# Patient Record
Sex: Female | Born: 1990
Health system: Southern US, Community
[De-identification: ages and names within clinical notes are randomized; demographics above are authoritative.]

## PROBLEM LIST (undated history)

## (undated) DIAGNOSIS — F32A Depression, unspecified: Secondary | ICD-10-CM

## (undated) DIAGNOSIS — G43909 Migraine, unspecified, not intractable, without status migrainosus: Secondary | ICD-10-CM

## (undated) DIAGNOSIS — F4310 Post-traumatic stress disorder, unspecified: Secondary | ICD-10-CM

## (undated) DIAGNOSIS — D680 Von Willebrand's disease: Secondary | ICD-10-CM

## (undated) DIAGNOSIS — F329 Major depressive disorder, single episode, unspecified: Secondary | ICD-10-CM

## (undated) DIAGNOSIS — K649 Unspecified hemorrhoids: Secondary | ICD-10-CM

## (undated) HISTORY — DX: Depression, unspecified: F32.A

## (undated) HISTORY — PX: NO PAST SURGERIES: SHX2092

## (undated) HISTORY — DX: Post-traumatic stress disorder, unspecified: F43.10

## (undated) HISTORY — DX: Major depressive disorder, single episode, unspecified: F32.9

---

## 2014-11-18 LAB — OB RESULTS CONSOLE HIV ANTIBODY (ROUTINE TESTING): HIV: NONREACTIVE

## 2014-11-18 LAB — OB RESULTS CONSOLE RUBELLA ANTIBODY, IGM: RUBELLA: NON-IMMUNE/NOT IMMUNE

## 2014-11-18 LAB — OB RESULTS CONSOLE HEPATITIS B SURFACE ANTIGEN: Hepatitis B Surface Ag: NEGATIVE

## 2014-11-18 LAB — OB RESULTS CONSOLE VARICELLA ZOSTER ANTIBODY, IGG: Varicella: IMMUNE

## 2014-11-18 LAB — OB RESULTS CONSOLE RPR: RPR: NONREACTIVE

## 2014-12-01 ENCOUNTER — Ambulatory Visit (HOSPITAL_BASED_OUTPATIENT_CLINIC_OR_DEPARTMENT_OTHER)
Admission: RE | Admit: 2014-12-01 | Discharge: 2014-12-01 | Disposition: A | Discharge status: Home / Self Care | Payer: Federal, State, Local not specified - PPO | Source: Ambulatory Visit | Attending: Obstetrics and Gynecology | Admitting: Obstetrics and Gynecology

## 2014-12-01 ENCOUNTER — Ambulatory Visit
Admission: RE | Admit: 2014-12-01 | Discharge: 2014-12-01 | Disposition: A | Discharge status: Home / Self Care | Payer: Federal, State, Local not specified - PPO | Source: Ambulatory Visit | Attending: Obstetrics and Gynecology | Admitting: Obstetrics and Gynecology

## 2014-12-01 VITALS — BP 115/69 | HR 70 | Temp 98.3°F | Resp 18 | Ht <= 58 in | Wt 154.0 lb

## 2014-12-01 DIAGNOSIS — Z3401 Encounter for supervision of normal first pregnancy, first trimester: Secondary | ICD-10-CM | POA: Diagnosis not present

## 2014-12-01 DIAGNOSIS — G43909 Migraine, unspecified, not intractable, without status migrainosus: Secondary | ICD-10-CM | POA: Insufficient documentation

## 2014-12-01 DIAGNOSIS — D6800 Von Willebrand disease, unspecified: Secondary | ICD-10-CM

## 2014-12-01 DIAGNOSIS — Z3A09 9 weeks gestation of pregnancy: Secondary | ICD-10-CM | POA: Diagnosis not present

## 2014-12-01 DIAGNOSIS — G43109 Migraine with aura, not intractable, without status migrainosus: Secondary | ICD-10-CM | POA: Diagnosis not present

## 2014-12-01 DIAGNOSIS — D6801 Von willebrand disease, type 1: Secondary | ICD-10-CM

## 2014-12-01 DIAGNOSIS — O26891 Other specified pregnancy related conditions, first trimester: Secondary | ICD-10-CM | POA: Diagnosis not present

## 2014-12-01 HISTORY — DX: Migraine, unspecified, not intractable, without status migrainosus: G43.909

## 2014-12-01 HISTORY — DX: Von Willebrand's disease: D68.0

## 2014-12-01 NOTE — Progress Notes (Signed)
Referring physician:  [facility] Length of Consultation: 40 minutes   Erica Serrano  was referred to [facility] for genetic counseling to discuss her personal history of von Willebrand disease, family history of childhood cancer,  and to review prenatal screening and testing options.  This note summarizes the information we discussed.  She also met with Dr. Jimmey Ralph to discuss her medical history and pregnancy management.  We obtained a detailed family history and pregnancy history.  This is the first pregnancy for Erica Serrano and her partner, Erica Serrano.  She is 24 years old and at [redacted] weeks gestation.  She reports a personal history of von Willebrand disease type 1 and complex migraines (see MFM note).  Her von Willebrand disease was diagnosed in adolescence when she experienced abnormal periods.  She reports easy bruising but no significant bleeding.  Dr. Leatha Gilding ordered a von Willebrand panel today as well as a consultation with hematology.  The patient will return to our clinic once the lab results are available for further discussion.  In the family, her paternal half sister is said to also have von Willebrand based upon verbal report.  No other family members are noted to have symptoms of this condition.  Also in the family, Erica Serrano reported that her sister passed away at 75 years of age due to a tumor which arose under her ribcage.  The patient is not aware of the tissue in which this tumor originated nor the type of tumor.  She did say that the family obtain records some time ago and upon review were told that it was a sporadic occurrence.  There may be many types of cancers that occur in childhood.  Some of those are thought to be the result of inherited genetic changes, while many occur by chance and would not be expected to increase the risk for other family members to have a similar condition.  There is no other family history of childhood tumors or cancer  occuring at young ages which would suggest a genetic syndrome; however, without additional medical information, we cannot comment further.  The remainder of the family history was reported to be unremarkable for birth defects, mental retardation, recurrent pregnancy loss or known chromosome abnormalities.  Erica Serrano has had no complications or exposures in this pregnancy that would be expected to increase the risk for birth defects.  She takes only tylenol for her migraines.  Von Willebrand disease is an inherited bleeding disorder.  Approximately 70% of individuals with the condition have type 1, which is the most mild form.  This type typically manifests with mild mucocutaneous bleeding and heavy periods. The condition is typically inherited as a dominant genetic trait caused by a change in the von Willebrand Factor gene.  Most individuals with this condition have an affected parent, though rarely it may be the result of a new mutation.  This means that when one parent has a gene change for the condition, there is a 50% chance that they will pass on the abnormal copy of the gene and a 50% chance to pass on the normal copy of the gene to each of their children.  There are rare reported families with a recessive form of type 1 vWD.  Genetic testing on a pregnancy or other family members is possible if the mutation in the family is known.  Prenatal testing is only available through invasive testing such as CVS or amniocentesis.  If a pregnancy is at risk  for vWD and the child is a female, then assessment of his clotting would be important prior to circumcision.  Testing of his clotting function could be performed after delivery.  We can discuss this further if desired at her follow up visit with MFM after all of Erica Serrano labs are available.  The following routine screening tests to assess for chromosome conditions were reviewed with the patient:  First trimester screening, which can include nuchal  translucency ultrasound screen and/or first trimester maternal serum marker screening.  The nuchal translucency has approximately an 80% detection rate for Down syndrome and can be positive for other chromosome abnormalities as well as congenital heart defects.  When combined with a maternal serum marker screening, the detection rate is up to 90% for Down syndrome and up to 97% for trisomy 18.     Maternal serum marker screening, a blood test that measures pregnancy proteins, can provide risk assessments for Down syndrome, trisomy 18, and open neural tube defects (spina bifida, anencephaly). Because it does not directly examine the fetus, it cannot positively diagnose or rule out these problems.  Targeted ultrasound uses high frequency sound waves to create an image of the developing fetus.  An ultrasound is often recommended as a routine means of evaluating the pregnancy.  It is also used to screen for fetal anatomy problems (for example, a heart defect) that might be suggestive of a chromosomal or other abnormality.   Should these screening tests indicate an increased concern, then the following additional testing options would be offered:  The chorionic villus sampling procedure is available for first trimester chromosome analysis.  This involves the withdrawal of a small amount of chorionic villi (tissue from the developing placenta).  Risk of pregnancy loss is estimated to be approximately 1 in 200 to 1 in 100 (0.5 to 1%).  There is approximately a 1% (1 in 100) chance that the CVS chromosome results will be unclear.  Chorionic villi cannot be tested for neural tube defects.     Amniocentesis involves the removal of a small amount of amniotic fluid from the sac surrounding the fetus with the use of a thin needle inserted through the maternal abdomen and uterus.  Ultrasound guidance is used throughout the procedure.  Fetal cells from amniotic fluid are directly evaluated and > 99.5% of chromosome  problems and > 98% of open neural tube defects can be detected. This procedure is generally performed after the 15th week of pregnancy.  The main risks to this procedure include complications leading to miscarriage in less than 1 in 200 cases (0.5%).  As another option for information if the pregnancy is suspected to be an an increased chance for certain chromosome conditions, we also reviewed the availability of cell free fetal DNA testing from maternal blood to determine whether or not the baby may have either Down syndrome, trisomy 61, or trisomy 50.  This test utilizes a maternal blood sample and DNA sequencing technology to isolate circulating cell free fetal DNA from maternal plasma.  The fetal DNA can then be analyzed for DNA sequences that are derived from the three most common chromosomes involved in aneuploidy, chromosomes 13, 18, and 21.  If the overall amount of DNA is greater than the expected level for any of these chromosomes, aneuploidy is suspected.  While we do not consider it a replacement for invasive testing and karyotype analysis, a negative result from this testing would be reassuring, though not a guarantee of a normal chromosome complement  for the baby.  An abnormal result is certainly suggestive of an abnormal chromosome complement, though we would still recommend CVS or amniocentesis to confirm any findings from this testing.  Cystic Fibrosis screening was also discussed with the patient. Cystic fibrosis (CF) is one of the most common genetic conditions in persons of Caucasian ancestry.  This condition occurs in approximately 1 in 2,500 Caucasian persons and results in thickened secretions in the lungs, digestive, and reproductive systems.  For a baby to be at risk for having CF, both of the parents must be carriers for this condition.  Approximately 1 in 49 Caucasian persons is a carrier for CF.  Current carrier testing looks for the most common mutations in the gene for CF and can  detect approximately 90% of carriers in the Caucasian population.  This means that the carrier screening can greatly reduce, but cannot eliminate, the chance for an individual to have a child with CF.  If an individual is found to be a carrier for CF, then carrier testing would be available for the partner. As part of [place] newborn screening profile, all babies born in the state of [place] will have a two-tier screening process.  Specimens are first tested to determine the concentration of immunoreactive trypsinogen (IRT).  The top 5% of specimens with the highest IRT values then undergo DNA testing using a panel of over 40 common CF mutations.   After consideration of the options, Erica Serrano elected to proceed with von Willebrand panel today and follow up consultations with MFM, hematology and neurology (see MFM note).  She is scheduled for first trimester screening at [facility] at the appropriate gestational age.  Erica Serrano was encouraged to call with questions or concerns.  We can be contacted at [phone].   Wilburt Finlay, MS, CGC

## 2014-12-01 NOTE — Progress Notes (Signed)
[facility] Consultation   Chief Complaint:  migraines in pregnancy , h/o von willebrands   HPI: Erica. Erica Serrano is a 24 y.o. G1P0 at 3w6dby LMP  who [facility]ra[facility] and Von Willebrands dz Type 1  Pt [facility] age 89[facility] to heavy menses . Dx of Von willebrands was made and pt given Stimate and OCPs . Pt used stimate (ddavp) only once. OCPs had to be adjusted because of effect on her migraines. No dental extractions, no surgeries . Pt was in [employer] [place]. She does have severe migraines since puberty . She has an aura and blurs vision in one eye . She develops numbness and loss of control of her right arm. She uses tylenol and rest . She has adjusted her lifestyle no caffeine, rare alcohol, no chocolate  etc with limited success . She has missed 2 days of classes at [employer] (she is a senior to graduate in December ) due to migraines too severe to tolerate light . She only uses tylenol . She tried topamax - no impact on her HAs, made her hands numb . She had one stretch of 18 months without a migraine. She is not clear what changed then. She does not have a neurologist.   Past Medical History: Patient  has a past medical history of Von Willebrand disease and Migraines.  Past Surgical History: She  has past surgical history that includes No past surgeries.  Obstetric History:  OB History    Gravida Para Term Preterm AB TAB SAB Ectopic Multiple Living   1              Gynecologic History:  Patient's last menstrual period was 10/03/2014.  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  Social  History: Patient  reports that she has never smoked. She has never used smokeless tobacco. She reports that she does not drink alcohol or use illicit drugs.  Family History: family history is not on file.  Review of Systems A full 12 point review of systems was negative or as noted in the History of Present Illness.  Physical Exam: BP 115/69 mmHg  Pulse 70  Temp(Src) 98.3 F (36.8 C) (Oral)  Resp 18  Ht 5.4" (0.137 m)  Wt 154 lb (69.854 kg)  BMI 3721.78 kg/m2  SpO2 99%  LMP 10/03/2014  Well appearing WF accompanied by her husband  No gross  neurologic deficits    Asessement: 1. Von Willebrand disease, type I   2. Von Willebrands disease   3. Migraine with aura and without status migrainosus, not intractable   4. Encounter for supervision of normal first pregnancy in first trimester     Plan: 1 I obtained a Von Willebrands panel and reviewed the risk of PPH and late pp bleeding - I told her that usually the amount of Von willebrands factor is increased in pregnancy , I called Dr FGrayland Ormondand requested a consult so that she might have a local hematologist - His office will contact her with an appointment. I requested she return when  Dr Jeneen Rinks is here to review her Von Willbrands panel and to discuss delivery planning. Consider a OB anesthesia consult in the third trimester due to increased risk of PPH. Avoid NSAIDs pp.  2 Ongoing  migraines impacting her daily function - I suggested she see a neurologist with familiarity treating pregnant pts - pt has not tried Triptans , beta blockers or amitryptiline - - I referred her to Dr Quentin Angst at [facility] for advice in controlling her HAs. Message was left with his scheduler and I emailed him since his next available is in February.    Total time spent with the patient was 30 minutes with greater than 50% spent in counseling and coordination of care. We appreciate this interesting consult and will be happy to be involved in the ongoing care of  Erica. Serrano in anyway her obstetricians desire.  Gatha Mayer MD  [place]

## 2014-12-02 LAB — VON WILLEBRAND PANEL
COAGULATION FACTOR VIII: 82 % (ref 50–150)
RISTOCETIN CO-FACTOR, PLASMA: 60 % (ref 50–150)
Von Willebrand Antigen, Plasma: 61 % (ref 50–150)

## 2014-12-02 LAB — COAG STUDIES INTERP REPORT: PDF IMAGE: 0

## 2014-12-22 ENCOUNTER — Inpatient Hospital Stay: Payer: Federal, State, Local not specified - PPO | Admitting: Oncology

## 2014-12-22 NOTE — Addendum Note (Signed)
Encounter addended by: Jimmey Ralph, MD on: 12/22/2014  8:52 AM<BR>     Documentation filed: Charges VN

## 2014-12-22 NOTE — Addendum Note (Signed)
Encounter addended by: Jimmey Ralph, MD on: 12/22/2014  8:42 AM<BR>     Documentation filed: Notes Section, Problem List

## 2014-12-22 NOTE — Progress Notes (Signed)
Pt with h/o of VWdz - VW labs drawn  And were normal  Pt has a h/o of heavy menses - monitor for late pp bleeding  Consider steering her away form aspirin and NSAIDs   If abnromal bleeding pattern occurs consider PFA 100 or other platelet function assay   Jimmey Ralph

## 2015-01-02 ENCOUNTER — Ambulatory Visit
Admission: RE | Admit: 2015-01-02 | Discharge: 2015-01-02 | Disposition: A | Discharge status: Home / Self Care | Payer: Federal, State, Local not specified - PPO | Source: Ambulatory Visit | Attending: Maternal & Fetal Medicine | Admitting: Maternal & Fetal Medicine

## 2015-01-02 VITALS — BP 134/76 | HR 75 | Temp 98.2°F | Wt 161.0 lb

## 2015-01-02 DIAGNOSIS — D680 Von Willebrand's disease: Secondary | ICD-10-CM

## 2015-01-02 DIAGNOSIS — G43101 Migraine with aura, not intractable, with status migrainosus: Secondary | ICD-10-CM | POA: Diagnosis not present

## 2015-01-02 DIAGNOSIS — Z3A14 14 weeks gestation of pregnancy: Secondary | ICD-10-CM | POA: Diagnosis not present

## 2015-01-02 DIAGNOSIS — D6801 Von willebrand disease, type 1: Secondary | ICD-10-CM

## 2015-01-02 NOTE — Progress Notes (Signed)
[facility] Consultation   Chief Complaint: VWD in pegnancy  HPI: [name] is a 24 y.o. G1P0 at [redacted]w[redacted]d by 8 week [name] at [facility] who was originally referred to [facility] due to her history of type 1 VWD and her history of complex migraines.  She is here to review her VWD panel and for  follow-up consultation regarding VWD in pregnancy.  The patient was originally diagnosed in the sixth grade at [employer] after suffering heavy menstrual bleeding she was bleeding 2 weeks out of the month. She was diagnosed with type I VWD.  She was prescribed oral contraceptive pills to reduce her heavy menstrual bleeding and regularly her menses. She was also given a prescription for Stimate, which she has used only twice in her life.  She has no current bleeding symptoms.  As for the complex migraines, she has had severe migraines since puberty . She has an aura and blurs vision in one eye . She develops numbness and loss of control of her right arm. She treats them with Tylenol and rest.  Dr. Lehman Prom referred her to Dr. Theda Sers, neurologist, at [facility], but the appointment was not until February. The patient was rescheduled with Dr. Melrose Nakayama at [facility], but the patient did not know that the appointment was today and missed it.    Gynecologic History:  Patient's last menstrual period was 10/03/2014.  History of heavy menstrual bleeding.    Past Medical History: Patient  has a past medical history of Von Willebrand disease and complex migraines.   Past Surgical History: No past surgeries.   Medications: PN vitamins  Allergies: Patient has No Known Allergies.   Social History: Patient  reports that she has never smoked. She has never used smokeless tobacco. She reports that she does not drink alcohol or use illicit drugs.  The patient is a Charity fundraiser at [employer]. Her husband is a Educational psychologist for in [employer].  Family  History: family history includes Cancer in her sister; Heart disease in her father - he underwent triple bypass without any bleeding complications; ?Von Willebrand disease in her half sister on her father's side.   Her husband, although he has had no surgery, has had absolutely no bleeding symptoms.    Review of Systems A full 12 point review of systems was negative or as noted in the History of Present Illness. She specifically denies any bleeding or spotting.  Physical Exam: BP 134/76 mmHg  Pulse 75  Temp(Src) 98.2 F (36.8 C)  Wt 161 lb (73.029 kg)  LMP 10/03/2014  VW Panel from 12/02/2014 Coagulation Factor VIII 50 - 150 % 82   Ristocetin Co-factor, Plasma 50 - 150 % 60   Von Willebrand Antigen, Plasma 50 - 150 % 61          Asessement: 24 yo gravida G1P0 at [redacted]w[redacted]d with: 1. Von Willebrand disease with low normal VWF levels now that she is pregnant 2. Complex migraine  Recommendations:  I met with the patient and her husband 1. Von Willebrand disease with low normal VWF levels now that she is pregnant -- The patient has previously been counseled that she is at increased risk for postpartum hemorrhage. I might add that she is at particularly increased risk for delayed postpartum hemorrhage. The risk of postpartum hemorrhage is approximately doubled in women with von Willebrand disease. -- Besides active management of the third stage of labor, I would recommend that a second line  oxytocic be ready in the delivery area. -- Given that her VWF levels are greater than 50% and that she has no history of hemorrhage, she should be able to deliver here at [facility]. -- She has already been referred to Dr. Maryjane Hurter in hematology so that she has a hematologist to follow her in the future, particularly after delivery. -- The patient was counseled that her child has a 50% chance of inheriting von Willebrand disease. Since her husband has no bleeding history and no family  history of a bleeding disorder, it is extremely unlikely that her child would have a severe bleeding disorder. Therefore, a cesarean delivery would not be necessary.  However, because of the slightly increased risk of intracranial hemorrhage, invasive procedures should be avoided such as fetal scalp electrode and operative vaginal delivery. There should be a low threshold for cesarean delivery. If operative vaginal delivery is unavoidable, forceps are preferred to vacuum extraction.  (The risk of intracranial hemorrhage is less with forceps and then with vacuum extraction.)  Vacuum extraction should also not be used to help deliver the fetal head at the time of a cesarean delivery. -- Should she be carrying a female fetus, I would recommend that a circumcision be postponed until the pediatrician has established that the infant does not have von Willebrand factor levels below 50%. -- At the time of delivery, I would recommend that umbilical cord blood in two light blue topped tubes be sent for APTT and a von Willebrand panel. -- I would recommend that prior to delivery, the patient have an opportunity to speak to the child's pediatric provider. -- I would suggest that the patient return to [place] at [redacted] weeks gestation to see me and I will repeat her VWF panel. 2. Complex migraine -- The patient will reschedule her appointment with Dr. Melrose Nakayama.  Total time spent with the patient was 40 minutes with greater than 50% spent in counseling and coordination of care.   Erasmo Score, MD [facility]

## 2015-01-05 ENCOUNTER — Inpatient Hospital Stay: Payer: Federal, State, Local not specified - PPO | Attending: Oncology | Admitting: Oncology

## 2015-01-05 VITALS — BP 112/78 | HR 101 | Temp 97.4°F | Resp 20 | Ht 64.0 in | Wt 162.9 lb

## 2015-01-05 DIAGNOSIS — Z79899 Other long term (current) drug therapy: Secondary | ICD-10-CM | POA: Insufficient documentation

## 2015-01-05 DIAGNOSIS — D6800 Von Willebrand disease, unspecified: Secondary | ICD-10-CM

## 2015-01-05 DIAGNOSIS — Z8669 Personal history of other diseases of the nervous system and sense organs: Secondary | ICD-10-CM | POA: Insufficient documentation

## 2015-01-05 DIAGNOSIS — Z3A15 15 weeks gestation of pregnancy: Secondary | ICD-10-CM | POA: Insufficient documentation

## 2015-01-05 DIAGNOSIS — Z809 Family history of malignant neoplasm, unspecified: Secondary | ICD-10-CM | POA: Diagnosis not present

## 2015-01-05 NOTE — Progress Notes (Signed)
Patient here today for initial hematology evaluation regarding Von Willebrands, patient reports she was diagnosed as a child. Patient is currently [redacted] weeks pregnant.

## 2015-01-13 DIAGNOSIS — D6800 Von Willebrand disease, unspecified: Secondary | ICD-10-CM

## 2015-01-13 HISTORY — DX: Von Willebrand's disease: D68.0

## 2015-01-13 HISTORY — DX: Von Willebrand disease, unspecified: D68.00

## 2015-01-21 NOTE — Progress Notes (Signed)
[facility]  [phone] [phone]  ID: Erica Serrano OB: [birth year]  MR#: [id number]  AOZ#:[id number]  Patient Care Team: Marta Antu, CNM as PCP - General (Certified Nurse Midwife)  CHIEF COMPLAINT:  Chief Complaint  Patient presents with  . New Evaluation    Von Willebrands (patient [redacted] weeks pregnant)    INTERVAL HISTORY: Patient is a 24 year old female with a history of von Willebrand's disease diagnosis a child who is now [redacted] weeks pregnant. She is referred to clinic for further evaluation and peripartum management of her von Willebrand's disease.  Patient was initially diagnosed as a teenager after heavy menstrual bleeding greater than 2 weeks of every month. She was placed on oral contraceptive pills to reduce her menstrual bleeding. She also has a prescription for Stimate which she has used only once or twice. She currently feels well and is asymptomatic. She has no recent recurrent episodes of bleeding. She is gaining weight appropriately. She has no neurological point. She denies any chest pain or shortness of breath. She denies any nausea, vomiting, constipation, or diarrhea. She has no urinary complaints. Patient feels at her baseline and offers no specific complaints today.  REVIEW OF SYSTEMS:   Review of Systems  Constitutional: Negative.   Respiratory: Negative.   Cardiovascular: Negative.   Gastrointestinal: Negative.   Musculoskeletal: Negative.   Neurological: Negative.   Endo/Heme/Allergies: Does not bruise/bleed easily.    As per HPI. Otherwise, a complete review of systems is negatve.  PAST MEDICAL HISTORY: Past Medical History  Diagnosis Date  . Von Willebrand disease (HCC)   . Migraines     PAST SURGICAL HISTORY: Past Surgical History  Procedure Laterality Date  . No past surgeries      FAMILY HISTORY Family History  Problem Relation Age of Onset  . Heart disease Father   . Von Willebrand disease Sister    . Cancer Sister        ADVANCED DIRECTIVES:    HEALTH MAINTENANCE: Social History  Substance Use Topics  . Smoking status: Never Smoker   . Smokeless tobacco: Never Used  . Alcohol Use: No     Colonoscopy:  PAP:  Bone density:  Lipid panel:  No Known Allergies  Current Outpatient Prescriptions  Medication Sig Dispense Refill  . Prenatal Vit-Fe Fumarate-FA (PRENATAL MULTIVITAMIN) TABS tablet Take 1 tablet by mouth daily at 12 noon.     No current facility-administered medications for this visit.    OBJECTIVE: Filed Vitals:   01/05/15 1538  BP: 112/78  Pulse: 101  Temp: 97.4 F (36.3 C)  Resp: 20     Body mass index is 27.95 kg/(m^2).    ECOG FS:0 - Asymptomatic  General: Well-developed, well-nourished, no acute distress. Eyes: Pink conjunctiva, anicteric sclera. HEENT: Normocephalic, moist mucous membranes, clear oropharnyx. Lungs: Clear to auscultation bilaterally. Heart: Regular rate and rhythm. No rubs, murmurs, or gallops. Abdomen: Appears appropriate for gestational age. Musculoskeletal: No edema, cyanosis, or clubbing. Neuro: Alert, answering all questions appropriately. Cranial nerves grossly intact. Skin: No rashes or petechiae noted. Psych: Normal affect.   LAB RESULTS: Von Willebrand's panel from September 2016 was within normal limits.  No results found for: NA, K, CL, CO2, GLUCOSE, BUN, CREATININE, CALCIUM, PROT, ALBUMIN, AST, ALT, ALKPHOS, BILITOT, GFRNONAA, GFRAA  No results found for: WBC, NEUTROABS, HGB, HCT, MCV, PLT   STUDIES: No results found.  ASSESSMENT: Von Willebrand's disease in pregnancy.  PLAN:    1. Von Willebrand's disease: Patient's levels on her  panel are likely within normal limits secondary to her pregnancy. Patient's levels of greater than 50% should be adequate during her pregnancy and during birth, but she is at risk for postpartum hemorrhage. Patient should have a von Willebrand's panel repeated just prior to  delivery. She also should have intranasal Stimate 300 g one spray in each nostril available for postpartum as a precaution. Patient's baby is at a 50% risk of also having von Willebrand's disease and appropriate measures were discussed by Dr. Fayrene FearingJames previously. Please see her note dated January 02, 2015 for further details. Patient will follow-up in early March 2017 just prior to her due date for further evaluation. Will further discuss patient with Dr. Fayrene FearingJames just prior to her due date.  Approximate 45 minutes was spent in discussion and consultation.  Patient expressed understanding and was in agreement with this plan. She also understands that She can call clinic at any time with any questions, concerns, or complaints.   No matching staging information was found for the patient.  Jeralyn Ruthsimothy J Missouri Lapaglia, MD   01/21/2015 7:15 PM

## 2015-03-26 NOTE — L&D Delivery Note (Signed)
Delivery Note At 10:27 AM a viable female was delivered via Vaginal, Spontaneous Delivery (Presentation: Right Occiput Anterior).  APGAR:8, 9; weight: 3750 .   Placenta status:spontaneous,intact.  Cord:  with the following complications: none.  Cord pH: NA  Called to see patient.  Mom pushed to delivery viable female infant.  The head followed by shoulders, which delivered without difficulty, and the rest of the body.  No nuchal cord noted.  Baby to mom's chest.  Cord clamped and cut after 1 min delay.  Cord blood obtained for ABO/Rh and Coags due to mom's VonWillebrands status.  Placenta delivered spontaneously, intact, with a 3-vessel cord.  Perineum intact.  All counts correct.  Hemostasis obtained with IV pitocin, rectal cytotec, and fundal massage. EBL 450 mL.    Anesthesia: Epidural  Episiotomy:  None  Lacerations:  None Suture Repair: NA Est. Blood Loss (mL):  450  Mom to postpartum.  Baby to Couplet care / Skin to Skin.  Tresea MallGLEDHILL,Shany Marinez, CNM  This patient and plan were discussed with Dr Elesa MassedWard 07/01/2015

## 2015-05-11 ENCOUNTER — Inpatient Hospital Stay: Admission: RE | Admit: 2015-05-11 | Payer: Federal, State, Local not specified - PPO | Source: Ambulatory Visit

## 2015-05-12 ENCOUNTER — Telehealth: Payer: Self-pay

## 2015-05-12 NOTE — Telephone Encounter (Signed)
Patient missed consult appointment yesterday with Dr. Fayrene Fearing.  Dr. Fayrene Fearing requested the patient have VonWillebrands panel drawn for her to review.  Patients states [facility] will be drawing the lab work.  Dr. Tiburcio Pea notified and requested to send results of panel when they are received to Dr. Fayrene Fearing.  Patient notified of this request and states she will inform [facility] as well.

## 2015-05-29 ENCOUNTER — Inpatient Hospital Stay: Payer: Federal, State, Local not specified - PPO | Attending: Oncology

## 2015-05-31 ENCOUNTER — Inpatient Hospital Stay: Payer: Federal, State, Local not specified - PPO | Admitting: Oncology

## 2015-06-05 LAB — OB RESULTS CONSOLE GC/CHLAMYDIA
Chlamydia: NEGATIVE
Gonorrhea: NEGATIVE

## 2015-06-05 LAB — OB RESULTS CONSOLE GBS: STREP GROUP B AG: NEGATIVE

## 2015-06-13 ENCOUNTER — Inpatient Hospital Stay: Payer: Federal, State, Local not specified - PPO | Attending: Oncology | Admitting: Oncology

## 2015-06-30 ENCOUNTER — Inpatient Hospital Stay
Admission: EM | Admit: 2015-06-30 | Discharge: 2015-07-03 | DRG: 775 | Disposition: A | Discharge status: Home / Self Care | Payer: Federal, State, Local not specified - PPO | Attending: Obstetrics and Gynecology | Admitting: Obstetrics and Gynecology

## 2015-06-30 DIAGNOSIS — O9912 Other diseases of the blood and blood-forming organs and certain disorders involving the immune mechanism complicating childbirth: Secondary | ICD-10-CM | POA: Diagnosis present

## 2015-06-30 DIAGNOSIS — Z3A40 40 weeks gestation of pregnancy: Secondary | ICD-10-CM | POA: Diagnosis not present

## 2015-06-30 DIAGNOSIS — G43109 Migraine with aura, not intractable, without status migrainosus: Secondary | ICD-10-CM | POA: Diagnosis present

## 2015-06-30 DIAGNOSIS — O4292 Full-term premature rupture of membranes, unspecified as to length of time between rupture and onset of labor: Secondary | ICD-10-CM | POA: Diagnosis present

## 2015-06-30 DIAGNOSIS — D62 Acute posthemorrhagic anemia: Secondary | ICD-10-CM | POA: Diagnosis present

## 2015-06-30 DIAGNOSIS — D680 Von Willebrand's disease: Secondary | ICD-10-CM | POA: Diagnosis present

## 2015-06-30 DIAGNOSIS — Z8249 Family history of ischemic heart disease and other diseases of the circulatory system: Secondary | ICD-10-CM

## 2015-06-30 DIAGNOSIS — O2243 Hemorrhoids in pregnancy, third trimester: Secondary | ICD-10-CM | POA: Diagnosis present

## 2015-06-30 DIAGNOSIS — O99354 Diseases of the nervous system complicating childbirth: Principal | ICD-10-CM | POA: Diagnosis present

## 2015-06-30 DIAGNOSIS — Z809 Family history of malignant neoplasm, unspecified: Secondary | ICD-10-CM

## 2015-06-30 DIAGNOSIS — Z79899 Other long term (current) drug therapy: Secondary | ICD-10-CM

## 2015-06-30 DIAGNOSIS — O4290 Premature rupture of membranes, unspecified as to length of time between rupture and onset of labor, unspecified weeks of gestation: Secondary | ICD-10-CM | POA: Diagnosis present

## 2015-06-30 HISTORY — DX: Unspecified hemorrhoids: K64.9

## 2015-06-30 MED ORDER — LIDOCAINE HCL (PF) 1 % IJ SOLN: 30.0000 mL | INTRAMUSCULAR | Status: DC | PRN

## 2015-06-30 MED ORDER — LACTATED RINGERS IV SOLN
500.0000 mL | INTRAVENOUS | Status: DC | PRN
  Administered 2015-07-01: 500 mL via INTRAVENOUS

## 2015-06-30 MED ORDER — OXYTOCIN BOLUS FROM INFUSION
500.0000 mL | INTRAVENOUS | Status: DC
  Administered 2015-07-01: 500 mL via INTRAVENOUS

## 2015-06-30 MED ORDER — ONDANSETRON HCL 4 MG/2ML IJ SOLN: 4.0000 mg | Freq: Four times a day (QID) | INTRAMUSCULAR | Status: DC | PRN

## 2015-06-30 MED ORDER — ACETAMINOPHEN 325 MG PO TABS
650.0000 mg | ORAL_TABLET | ORAL | Status: DC | PRN
  Administered 2015-07-01: 650 mg via ORAL
  Filled 2015-06-30: qty 2

## 2015-06-30 MED ORDER — LACTATED RINGERS IV SOLN
INTRAVENOUS | Status: DC
  Administered 2015-07-01 (×2): via INTRAVENOUS

## 2015-06-30 MED ORDER — CITRIC ACID-SODIUM CITRATE 334-500 MG/5ML PO SOLN: 30.0000 mL | ORAL | Status: DC | PRN

## 2015-06-30 MED ORDER — OXYTOCIN 40 UNITS IN LACTATED RINGERS INFUSION - SIMPLE MED
2.5000 [IU]/h | INTRAVENOUS | Status: DC
  Administered 2015-07-01: 2.5 [IU]/h via INTRAVENOUS
  Filled 2015-06-30: qty 1000

## 2015-06-30 MED ORDER — FENTANYL CITRATE (PF) 100 MCG/2ML IJ SOLN
50.0000 ug | INTRAMUSCULAR | Status: DC | PRN
  Administered 2015-07-01: 50 ug via INTRAVENOUS
  Filled 2015-06-30: qty 2

## 2015-06-30 MED ORDER — DESMOPRESSIN ACETATE 1.5 MG/ML NA SOLN: 2.0000 | Freq: Once | NASAL | Status: DC

## 2015-06-30 NOTE — H&P (Signed)
Obstetrics Admission History & Physical  06/30/2015 - 11:27 PM Primary OBGYN: [facility]  Chief Complaint: SROM (clear) @ 2145 today  History of Present Illness  25 y.o. G1 @ [redacted]w[redacted]d (Dating: 8wk u/s), with the above CC. Pregnancy complicated by: vWD, complex migraines, BMI 34, hemorrhoids  [name] states that she had the above CC. Irregular UCs, no VB or decreased FM. Pt states UCs are starting to feel stronger, though.   Review of Systems: her 12 point review of systems is negative or as noted in the History of Present Illness.  PMHx:  Past Medical History  Diagnosis Date  . Von Willebrand disease (HCC)   . Migraines   . Hemorrhoid    PSHx:  Past Surgical History  Procedure Laterality Date  . No past surgeries     Medications:  Prescriptions prior to admission  Medication Sig Dispense Refill Last Dose  . Prenatal Vit-Fe Fumarate-FA (PRENATAL MULTIVITAMIN) TABS tablet Take 1 tablet by mouth daily at 12 noon.   06/29/2015 at Unknown time     Allergies: has No Known Allergies. OBHx:  OB History  Gravida Para Term Preterm AB SAB TAB Ectopic Multiple Living  1             # Outcome Date GA Lbr Len/2nd Weight Sex Delivery Anes PTL Lv  1 Current              GYNHx:  History of abnormal pap smears: No. History of STIs: No..             FHx:  Family History  Problem Relation Age of Onset  . Heart disease Father   . Von Willebrand disease Sister   . Cancer Sister    Soc Hx:  Social History   Social History  . Marital Status: Married    Spouse Name: N/A  . Number of Children: N/A  . Years of Education: N/A   Occupational History  . Not on file.   Social History Main Topics  . Smoking status: Never Smoker   . Smokeless tobacco: Never Used  . Alcohol Use: No  . Drug Use: No  . Sexual Activity: Yes   Other Topics Concern  . Not on file   Social History Narrative    Objective    Current Vital Signs 24h Vital Sign Ranges  T  98 No Data Recorded   BP 113/87 mmHg BP  Min: 113/87  Max: 113/87  HR (!) 104 Pulse  Avg: 104  Min: 104  Max: 104  RR  16 No Data Recorded  SaO2     No Data Recorded       24 Hour I/O Current Shift I/O  Time Ins Outs       EFM: 140 baseline, +accels, no decel, mod var  Toco: q3-32m, +irritability  General: Well nourished, well developed female in no acute distress.  Skin:  Warm and dry.  Cardiovascular: Regular rate and rhythm. Respiratory:  Clear to auscultation bilateral. Normal respiratory effort Abdomen: gravid, nttp Neuro/Psych:  Normal mood and affect.   SVE: 3cm per RN, clear fluid continuing to leak out. Pelvis felt adequate in clinic yesterday Leopolds/EFW: cephalic, 3800gm  Labs  pending  Radiology none  Perinatal info  O pos/ Rubella  Not immune / Varicella Immune/RPR pending/HIV Negative/HepB Surf Ag Negative/TDaP:yes / Flu shot: yes/pap neg 2015/  Assessment & Plan   25 y.o. G1 @ [redacted]w[redacted]d with PROM; pt status reassuring *IUP: category I with accels *PROM:  augment with pitocin if UCs space out, to decrease infection risk *Heme: [facility] by MFM and [facility] during pregnancy. S/p normal 36wk vWD panel. Recommends: -No FSE -No VAVD -Forceps only if life saving -low threshold for c-section -If female (female on anatomy scan), hold circ until vWD studies are back -Umbilical cord blood: two light blue tops for aPTT and vWD panel -Stimate 300mcg per nostril post partum Will get baseline maternal PTT/INR *GBS: neg *Analgesia: no needs. IV PRNs, may have epidural when desires *Migraines: no needs  Erica Copaharlie Katheline Serrano, Jr. MD [facility] Pager [phone]

## 2015-06-30 NOTE — OB Triage Note (Signed)
Pt arrived to obs rm 3 with c/o SROM at 2145. No contractions, no bleeding, good fetal movement. Placed on monitor and oriented to room.

## 2015-07-01 ENCOUNTER — Inpatient Hospital Stay: Payer: Federal, State, Local not specified - PPO | Admitting: Anesthesiology

## 2015-07-01 LAB — CBC
HEMATOCRIT: 35.7 % (ref 35.0–47.0)
Hemoglobin: 12.2 g/dL (ref 12.0–16.0)
MCH: 28.3 pg (ref 26.0–34.0)
MCHC: 34.1 g/dL (ref 32.0–36.0)
MCV: 83.1 fL (ref 80.0–100.0)
PLATELETS: 162 10*3/uL (ref 150–440)
RBC: 4.3 MIL/uL (ref 3.80–5.20)
RDW: 13.8 % (ref 11.5–14.5)
WBC: 14.4 10*3/uL — AB (ref 3.6–11.0)

## 2015-07-01 LAB — ABO/RH: ABO/RH(D): O POS

## 2015-07-01 LAB — TYPE AND SCREEN
ABO/RH(D): O POS
Antibody Screen: NEGATIVE

## 2015-07-01 LAB — PROTIME-INR
INR: 0.98
Prothrombin Time: 13.2 seconds (ref 11.4–15.0)

## 2015-07-01 LAB — APTT: APTT: 27 s (ref 24–36)

## 2015-07-01 MED ORDER — BUPIVACAINE HCL (PF) 0.25 % IJ SOLN
INTRAMUSCULAR | Status: DC | PRN
  Administered 2015-07-01: 5 mL via EPIDURAL

## 2015-07-01 MED ORDER — MISOPROSTOL 200 MCG PO TABS
800.0000 ug | ORAL_TABLET | Freq: Once | ORAL | Status: AC
  Administered 2015-07-01: 800 ug via RECTAL

## 2015-07-01 MED ORDER — ACETAMINOPHEN 325 MG PO TABS
650.0000 mg | ORAL_TABLET | ORAL | Status: DC | PRN
  Administered 2015-07-01: 650 mg via ORAL
  Filled 2015-07-01: qty 2

## 2015-07-01 MED ORDER — PRENATAL MULTIVITAMIN CH
1.0000 | ORAL_TABLET | Freq: Every day | ORAL | Status: DC
  Administered 2015-07-02: 1 via ORAL
  Filled 2015-07-01: qty 1

## 2015-07-01 MED ORDER — SENNOSIDES-DOCUSATE SODIUM 8.6-50 MG PO TABS
2.0000 | ORAL_TABLET | ORAL | Status: DC
  Administered 2015-07-01 – 2015-07-02 (×2): 2 via ORAL
  Filled 2015-07-01 (×2): qty 2

## 2015-07-01 MED ORDER — DIPHENHYDRAMINE HCL 50 MG/ML IJ SOLN: 12.5000 mg | INTRAMUSCULAR | Status: DC | PRN

## 2015-07-01 MED ORDER — DIPHENHYDRAMINE HCL 25 MG PO CAPS: 25.0000 mg | ORAL_CAPSULE | ORAL | Status: DC | PRN

## 2015-07-01 MED ORDER — DIPHENHYDRAMINE HCL 25 MG PO CAPS: 25.0000 mg | ORAL_CAPSULE | Freq: Four times a day (QID) | ORAL | Status: DC | PRN

## 2015-07-01 MED ORDER — WITCH HAZEL-GLYCERIN EX PADS: 1.0000 "application " | MEDICATED_PAD | CUTANEOUS | Status: DC | PRN

## 2015-07-01 MED ORDER — SIMETHICONE 80 MG PO CHEW: 80.0000 mg | CHEWABLE_TABLET | ORAL | Status: DC | PRN

## 2015-07-01 MED ORDER — OXYTOCIN 10 UNIT/ML IJ SOLN
INTRAMUSCULAR | Status: AC
  Filled 2015-07-01: qty 2

## 2015-07-01 MED ORDER — ONDANSETRON HCL 4 MG/2ML IJ SOLN: 4.0000 mg | INTRAMUSCULAR | Status: DC | PRN

## 2015-07-01 MED ORDER — LANOLIN HYDROUS EX OINT: TOPICAL_OINTMENT | CUTANEOUS | Status: DC | PRN

## 2015-07-01 MED ORDER — ONDANSETRON HCL 4 MG PO TABS: 4.0000 mg | ORAL_TABLET | ORAL | Status: DC | PRN

## 2015-07-01 MED ORDER — NALBUPHINE HCL 10 MG/ML IJ SOLN: 5.0000 mg | INTRAMUSCULAR | Status: DC | PRN

## 2015-07-01 MED ORDER — SODIUM CHLORIDE 0.9% FLUSH
3.0000 mL | Freq: Four times a day (QID) | INTRAVENOUS | Status: DC
  Administered 2015-07-01 – 2015-07-02 (×2): 3 mL via INTRAVENOUS

## 2015-07-01 MED ORDER — SODIUM CHLORIDE 0.9% FLUSH: 3.0000 mL | INTRAVENOUS | Status: DC | PRN

## 2015-07-01 MED ORDER — NALBUPHINE HCL 10 MG/ML IJ SOLN: 5.0000 mg | Freq: Once | INTRAMUSCULAR | Status: DC | PRN

## 2015-07-01 MED ORDER — DESMOPRESSIN ACE SPRAY REFRIG 0.01 % NA SOLN
3.0000 | Freq: Once | NASAL | Status: AC
  Administered 2015-07-01: 3 via NASAL
  Filled 2015-07-01: qty 5

## 2015-07-01 MED ORDER — FENTANYL 2.5 MCG/ML W/ROPIVACAINE 0.2% IN NS 100 ML EPIDURAL INFUSION (ARMC-ANES): 10.0000 mL/h | EPIDURAL | Status: DC

## 2015-07-01 MED ORDER — LIDOCAINE HCL (PF) 1 % IJ SOLN
INTRAMUSCULAR | Status: AC
  Administered 2015-07-01: 1 mL via INTRADERMAL
  Filled 2015-07-01: qty 30

## 2015-07-01 MED ORDER — BUTORPHANOL TARTRATE 1 MG/ML IJ SOLN
2.0000 mg | INTRAMUSCULAR | Status: DC | PRN
  Administered 2015-07-01: 2 mg via INTRAVENOUS

## 2015-07-01 MED ORDER — LIDOCAINE-EPINEPHRINE (PF) 1.5 %-1:200000 IJ SOLN
INTRAMUSCULAR | Status: DC | PRN
  Administered 2015-07-01: 3 mL via EPIDURAL

## 2015-07-01 MED ORDER — NALOXONE HCL 0.4 MG/ML IJ SOLN: 0.4000 mg | INTRAMUSCULAR | Status: DC | PRN

## 2015-07-01 MED ORDER — BUTORPHANOL TARTRATE 1 MG/ML IJ SOLN
INTRAMUSCULAR | Status: AC
  Administered 2015-07-01: 2 mg via INTRAVENOUS
  Filled 2015-07-01: qty 2

## 2015-07-01 MED ORDER — MISOPROSTOL 200 MCG PO TABS
ORAL_TABLET | ORAL | Status: AC
  Administered 2015-07-01: 800 ug via RECTAL
  Filled 2015-07-01: qty 4

## 2015-07-01 MED ORDER — ONDANSETRON HCL 4 MG/2ML IJ SOLN: 4.0000 mg | Freq: Three times a day (TID) | INTRAMUSCULAR | Status: DC | PRN

## 2015-07-01 MED ORDER — DEXTROSE 5 % IV SOLN: 1.0000 ug/kg/h | INTRAVENOUS | Status: DC | PRN

## 2015-07-01 MED ORDER — DIBUCAINE 1 % RE OINT: 1.0000 "application " | TOPICAL_OINTMENT | RECTAL | Status: DC | PRN

## 2015-07-01 MED ORDER — AMMONIA AROMATIC IN INHA
RESPIRATORY_TRACT | Status: AC
  Filled 2015-07-01: qty 10

## 2015-07-01 MED ORDER — IBUPROFEN 600 MG PO TABS
600.0000 mg | ORAL_TABLET | Freq: Four times a day (QID) | ORAL | Status: DC
  Administered 2015-07-01: 600 mg via ORAL
  Filled 2015-07-01: qty 1

## 2015-07-01 MED ORDER — FENTANYL 2.5 MCG/ML W/ROPIVACAINE 0.2% IN NS 100 ML EPIDURAL INFUSION (ARMC-ANES)
EPIDURAL | Status: AC
  Administered 2015-07-01: 10 mL/h via EPIDURAL
  Filled 2015-07-01: qty 100

## 2015-07-01 MED ORDER — BENZOCAINE-MENTHOL 20-0.5 % EX AERO
1.0000 "application " | INHALATION_SPRAY | CUTANEOUS | Status: DC | PRN
  Administered 2015-07-01: 1 via TOPICAL
  Filled 2015-07-01: qty 56

## 2015-07-01 NOTE — Progress Notes (Signed)
L&D Note  07/01/2015 - 7:49 AM  25 y.o. G1 10436w1d. Pregnancy complicated by vWD, complex migraines, BMI 34, hemorrhoids  [name] is admitted for SROM @ 2145 yesterday   Subjective:  Comfortable with epidural in place. ?epidural hotspot vs fetal positioning on right hip  Objective:    Current Vital Signs 24h Vital Sign Ranges  T 98.8 F (37.1 C) Temp  Avg: 98.2 F (36.8 C)  Min: 97.9 F (36.6 C)  Max: 98.8 F (37.1 C)  BP 121/84 mmHg BP  Min: 92/57  Max: 127/85  HR 97 Pulse  Avg: 99.9  Min: 85  Max: 120  RR 16 Resp  Avg: 15.5  Min: 15  Max: 16  SaO2   Not Delivered No Data Recorded       24 Hour I/O Current Shift I/O  Time Ins Outs 04/07 0701 - 04/08 0700 In: 1385.6 [I.V.:1372.9] Out: -      FHR: 145 baseline, rare accels, +subtel early decels, rare slight variables, mod var Toco: q1-6811m Gen: NAD SVE: +forebag-->AROM clear fluid-->complete/0+/no caput or molding. Unable to tell position  Labs:   Recent Labs Lab 07/01/15 0001  WBC 14.4*  HGB 12.2  HCT 35.7  PLT 162   O POS   Recent Labs Lab 07/01/15 0106  APTT 27  INR 0.98    Medications Current Facility-Administered Medications  Medication Dose Route Frequency Provider Last Rate Last Dose  . acetaminophen (TYLENOL) tablet 650 mg  650 mg Oral Q4H PRN [place] Bingharlie Malay Fantroy, MD      . ammonia inhalant           . butorphanol (STADOL) injection 2 mg  2 mg Intravenous Q1H PRN [place] Bingharlie Lilybeth Vien, MD   2 mg at 07/01/15 0359  . citric acid-sodium citrate (ORACIT) solution 30 mL  30 mL Oral Q2H PRN [place] Bingharlie Taira Knabe, MD      . desmopressin (STIMATE) nasal spray 0.3 mg  2 spray Nasal Once [place] Bingharlie Duyen Beckom, MD      . diphenhydrAMINE (BENADRYL) injection 12.5 mg  12.5 mg Intravenous Q4H PRN Rosaria FerriesJoseph K Piscitello, MD       Or  . diphenhydrAMINE (BENADRYL) capsule 25 mg  25 mg Oral Q4H PRN Rosaria FerriesJoseph K Piscitello, MD      . fentaNYL (SUBLIMAZE) injection 50 mcg  50 mcg Intravenous Q1H PRN [place] Bingharlie Kael Keetch, MD   50 mcg at  07/01/15 0317  . fentaNYL 2.5 mcg/ml w/ropivacaine 0.2% (preservative free) in normal saline 100 mL EPIDURAL Infusion in 150 ml Intravia Bag  10 mL/hr Epidural Continuous [place] MccreedyJoseph K Piscitello, MD      . lactated ringers infusion 500-1,000 mL  500-1,000 mL Intravenous PRN [place] Bingharlie Breyon Blass, MD   Stopped at 07/01/15 0350  . lactated ringers infusion   Intravenous Continuous [place] Bingharlie Ayanni Tun, MD 125 mL/hr at 07/01/15 0351    . lidocaine (PF) (XYLOCAINE) 1 % injection 30 mL  30 mL Subcutaneous PRN [place] Bingharlie Ramla Hase, MD      . misoprostol (CYTOTEC) 200 MCG tablet           . nalbuphine (NUBAIN) injection 5 mg  5 mg Intravenous Q4H PRN Rosaria FerriesJoseph K Piscitello, MD       Or  . nalbuphine (NUBAIN) injection 5 mg  5 mg Subcutaneous Q4H PRN Rosaria FerriesJoseph K Piscitello, MD      . nalbuphine (NUBAIN) injection 5 mg  5 mg Intravenous Once PRN Rosaria FerriesJoseph K Piscitello, MD       Or  . nalbuphine (NUBAIN) injection 5  mg  5 mg Subcutaneous Once PRN Rosaria Ferries, MD      . naloxone [place]) 2 mg in dextrose 5 % 250 mL infusion  1-4 mcg/kg/hr Intravenous Continuous PRN Rosaria Ferries, MD      . naloxone [facility]) injection 0.4 mg  0.4 mg Intravenous PRN Rosaria Ferries, MD       And  . sodium chloride flush (NS) 0.9 % injection 3 mL  3 mL Intravenous PRN Rosaria Ferries, MD      . ondansetron [facility]) injection 4 mg  4 mg Intravenous Q6H PRN [place] Bing, MD      . ondansetron (ZOFRAN) injection 4 mg  4 mg Intravenous Q8H PRN Rosaria Ferries, MD      . oxytocin (PITOCIN) 10 UNIT/ML injection           . oxytocin (PITOCIN) IV BOLUS FROM BAG  500 mL Intravenous Continuous [place] Bing, MD      . oxytocin (PITOCIN) IV infusion 40 units in LR 1000 mL - Premix  2.5 Units/hr Intravenous Continuous [place] Bing, MD       Facility-Administered Medications Ordered in Other Encounters  Medication Dose Route Frequency Provider Last Rate Last Dose  . bupivacaine (PF) (MARCAINE) 0.25 % injection    Anesthesia  Intra-op Rosaria Ferries, MD   5 mL at 07/01/15 0439  . lidocaine-EPINEPHrine 1.5 %-1:200000 injection    Anesthesia Intra-op Rosaria Ferries, MD   3 mL at 07/01/15 0434    Assessment & Plan:  Pt doing well *IUP: currently category I and fetal status reassuring *Labor: pt still contracting well on her own. Will sit pt up and reassess in 1-1.5hrs. Hopefully more feeling and stronger UC sensation and can start to push. Pelvis feels adequate *Heme: seen by MFM and Heme at [facility] during pregnancy. S/p normal 36wk vWD panel. Recommends: -No FSE -No VAVD -Forceps only if life saving -low threshold for c-section -If female (female on anatomy scan), hold circ until vWD studies are back -Umbilical cord blood: two light blue tops for aPTT and vWD panel -Stimate per nostril post partum Baseline coag's negative *GBS: neg *Analgesia: epidural in place and working well *Migraines: no needs  Plan of care d/w Dr. Elesa Massed and CNM Elpidio Anis MD [facility] Pager [phone]

## 2015-07-01 NOTE — Anesthesia Preprocedure Evaluation (Addendum)
Anesthesia Evaluation  Patient identified by MRN, date of birth, ID band Patient awake    Reviewed: Allergy & Precautions, H&P , NPO status , Patient's Chart, lab work & pertinent test results, reviewed documented beta blocker date and time   Airway Mallampati: III  TM Distance: >3 FB Neck ROM: full    Dental no notable dental hx. (+) Teeth Intact   Pulmonary neg pulmonary ROS, neg shortness of breath,    Pulmonary exam normal breath sounds clear to auscultation       Cardiovascular Exercise Tolerance: Good (-) hypertensionnegative cardio ROS Normal cardiovascular exam Rhythm:regular Rate:Normal     Neuro/Psych  Headaches, negative psych ROS   GI/Hepatic negative GI ROS, Neg liver ROS,   Endo/Other  negative endocrine ROS  Renal/GU negative Renal ROS  negative genitourinary   Musculoskeletal   Abdominal   Peds  Hematology  (+) Blood dyscrasia, ,   Anesthesia Other Findings Past Medical History:   Von Willebrand disease (HCC)                                 Migraines                                                    Hemorrhoid                                                  Past Surgical History:   NO PAST SURGERIES                                               Reproductive/Obstetrics (+) Pregnancy                             Anesthesia Physical Anesthesia Plan  ASA: III  Anesthesia Plan: Regional and Epidural   Post-op Pain Management:    Induction:   Airway Management Planned:   Additional Equipment:   Intra-op Plan:   Post-operative Plan:   Informed Consent: I have reviewed the patients History and Physical, chart, labs and discussed the procedure including the risks, benefits and alternatives for the proposed anesthesia with the patient or authorized representative who has indicated his/her understanding and acceptance.     Plan Discussed with: CRNA  Anesthesia  Plan Comments: (Patient has been medically cleared by her oncologist to receive a labor epidural: Patient is a 25 year old female who is actively in labor with a history of von Willebrand's disease. By report laboratory work drawn at her obstetrics office as an outpatient on June 06, 2015 noted a factor VIII level of 154, von Willebrand's level of 155, von Willebrand factor activity level 123. These are all within normal limits. As expected, patients with von Willebrand's typically improve to normal levels throughout pregnancy. If levels improve to greater than 50, these patients typically do not have any risk of increased bleeding over a patient without von Willebrand's disease. Okay to proceed with epidural.)  Anesthesia Quick Evaluation  

## 2015-07-01 NOTE — Consult Note (Signed)
Patient is a 25 year old female who is actively in labor with a history of von Willebrand's disease. By report laboratory work drawn at her obstetrics office as an outpatient on June 06, 2015 noted a factor VIII level of 154, von Willebrand's level of 155, von Willebrand factor activity level 123. These are all within normal limits. As expected, patients with von Willebrand's typically improve to normal levels throughout pregnancy. If levels improve to greater than 50, these patients typically do not have any risk of increased bleeding over a patient without von Willebrand's disease. Okay to proceed with epidural.

## 2015-07-01 NOTE — Discharge Summary (Signed)
OB Discharge Summary  Patient Name: Erica Serrano DOB: 29-Apr-1990 MRN: [id number]  Date of admission: 06/30/2015 Delivering MD: Rod Can, CNM Date of Delivery: 07/01/2015  Date of discharge: 07/03/2015  Admitting diagnosis: 57 weeks water broke Intrauterine pregnancy: [redacted]w[redacted]d    Secondary diagnosis: VonWillebrands     Discharge diagnosis: Term Pregnancy Delivered / anemia from acute blood loss                                                                                           Post partum procedures:Vasopressin nasal spray after delivery/ MMR  Augmentation: none  Complications: None  Hospital course:  Onset of Labor With Vaginal Delivery     25y.o. yo G1P0 at 481w1das admitted in Latent Labor on 06/30/2015. Patient had an uncomplicated labor course as follows:  Membrane Rupture Time/Date: 9:45 PM ,06/30/2015   Intrapartum Procedures: Episiotomy: None [1]                                         Lacerations:  None [1]  Patient had a delivery of a Viable female infant Cheyene 8#4.3oz 07/01/2015  Information for the patient's newborn:  WhZamora, Colton0[[id number]]Delivery Method: Vag-Spont     Pateint had an uncomplicated postpartum course.  She is ambulating, tolerating a regular diet,  and urinating well. Bleeding has been appropriate. Patient is discharged home in stable condition on 07/03/15.    Physical exam  Filed Vitals:   07/02/15 1404 07/02/15 1617 07/02/15 2100 07/03/15 0711  BP: 115/62 121/71 118/67 95/55  Pulse: 82 88 92 74  Temp: 98.2 F (36.8 C) 98.5 F (36.9 C) 98.4 F (36.9 C) 98.2 F (36.8 C)  TempSrc: Oral Oral  Oral  Resp: _0 Height:      Weight:      SpO2:  100%     General: alert, cooperative and no distress Lochia: appropriate Uterine Fundus: firm at U/ML/NT Incision: N/A DVT Evaluation: No evidence of DVT seen on physical exam.  Labs: Lab Results  Component Value Date   WBC 12.0* 07/02/2015   HGB 9.6* 07/02/2015    HCT 28.7* 07/02/2015   MCV 84.9 07/02/2015   PLT 144* 07/02/2015    Discharge instruction: per After Visit Summary.  Medications:    Medication List    TAKE these medications        ferrous fumarate 325 (106 Fe) MG Tabs tablet  Commonly known as:  HEMOCYTE - 106 mg FE  Take 1 tablet (106 mg of iron total) by mouth daily.     norethindrone 0.35 MG tablet  Commonly known as:  MICRONOR,CAMILA,ERRIN  Take 1 tablet (0.35 mg total) by mouth daily.     prenatal multivitamin Tabs tablet  Take 1 tablet by mouth daily at 12 noon.        Diet: routine diet  Activity: Advance as tolerated. Pelvic rest for 6 weeks.   Outpatient follow up: 6 week postpartum visit Postpartum contraception: Progesterone only pills Rhogam Given postpartum:  NA Rubella vaccine given postpartum: yes Varicella vaccine given postpartum: Immune TDaP given antepartum or postpartum: TDaP given AP Flu vaccine given antepartum or postpartum: given AP  Newborn Data: Live born female / Cheyene Birth Weight: 8 lb 4.3 oz (3750 g) APGAR: 8, 9   Baby Feeding: Breast  Disposition:home with mother  Dalia Heading, CNM

## 2015-07-01 NOTE — Anesthesia Procedure Notes (Signed)
Epidural Patient location during procedure: OB Start time: 07/01/2015 4:31 AM End time: 07/01/2015 4:33 AM  Staffing Anesthesiologist: Margorie JohnPISCITELLO, JOSEPH K Performed by: anesthesiologist   Preanesthetic Checklist Completed: patient identified, site marked, surgical consent, pre-op evaluation, timeout performed, IV checked, risks and benefits discussed and monitors and equipment checked  Epidural Patient position: sitting Prep: Betadine Patient monitoring: heart rate, continuous pulse ox and blood pressure Approach: midline Location: L4-L5 Injection technique: LOR saline  Needle:  Needle type: Tuohy  Needle gauge: 17 G Needle length: 9 cm and 9 Needle insertion depth: 8 cm Catheter type: closed end flexible Catheter size: 19 Gauge Catheter at skin depth: 12 cm Test dose: negative and 1.5% lidocaine with Epi 1:200 K  Assessment Sensory level: T10 Events: blood not aspirated, injection not painful, no injection resistance, negative IV test and no paresthesia  Additional Notes One attempt  Patient tolerated the insertion well without immediate complications.Reason for block:procedure for pain

## 2015-07-02 LAB — CBC
HCT: 28.7 % — ABNORMAL LOW (ref 35.0–47.0)
Hemoglobin: 9.6 g/dL — ABNORMAL LOW (ref 12.0–16.0)
MCH: 28.3 pg (ref 26.0–34.0)
MCHC: 33.3 g/dL (ref 32.0–36.0)
MCV: 84.9 fL (ref 80.0–100.0)
Platelets: 144 10*3/uL — ABNORMAL LOW (ref 150–440)
RBC: 3.38 MIL/uL — ABNORMAL LOW (ref 3.80–5.20)
RDW: 14.1 % (ref 11.5–14.5)
WBC: 12 10*3/uL — ABNORMAL HIGH (ref 3.6–11.0)

## 2015-07-02 LAB — RPR: RPR Ser Ql: NONREACTIVE

## 2015-07-02 NOTE — Anesthesia Postprocedure Evaluation (Signed)
Anesthesia Post Note  Patient: Erica ShackletonJenna Serrano  Procedure(s) Performed: * No procedures listed *  Patient location during evaluation: Mother Baby Anesthesia Type: Epidural Level of consciousness: awake and alert and oriented Pain management: pain level controlled Vital Signs Assessment: post-procedure vital signs reviewed and stable Respiratory status: spontaneous breathing Cardiovascular status: blood pressure returned to baseline Postop Assessment: no headache Anesthetic complications: no    Last Vitals:  Filed Vitals:   07/02/15 0405 07/02/15 0834  BP: 116/68 110/72  Pulse: 66 86  Temp: 36.5 C 36.4 C  Resp: 20 16    Last Pain:  Filed Vitals:   07/02/15 0933  PainSc: 0-No pain                 Jemell Town

## 2015-07-02 NOTE — Progress Notes (Signed)
Post Partum Day 1 Subjective: Doing well, had one orange size clot yesterday afternoon but no clots since.  Tolerating regular diet, pain with PO meds, voiding and ambulating without difficulty.   No CP SOB F/C N/V or leg pain No HA change of vision, RUQ/epigastric pain  Objective: BP 110/72 mmHg  Pulse 86  Temp(Src) 97.6 F (36.4 C) (Oral)  Resp 16  Ht 5\' 5"  (1.651 m)  Wt 92.534 kg (204 lb)  BMI 33.95 kg/m2  SpO2 100%  LMP 10/03/2014  Physical Exam:  General: NAD CV: RRR Pulm: nl effort, CTABL Lochia: moderate Uterine Fundus: fundus firm and below umbilicus DVT Evaluation: no cords, ttp LEs    Recent Labs  07/01/15 0001 07/02/15 0525  HGB 12.2 9.6*  HCT 35.7 28.7*  WBC 14.4* 12.0*  PLT 162 144*    Assessment/Plan: 25 y.o. G1P1 with von Willebrands stable postpartum day # 1   1. Continue routine postpartum care 2. Breastfeeding/mini pill 3. DC IV access today if bleeding is stable 4. RI/VI/TDAP UTD 4. Disposition: likely DC tomorrow    Tresea MallGLEDHILL,Hamid Brookens, CNM   This patient and plan were discussed with Dr Elesa MassedWard 07/02/2015

## 2015-07-03 MED ORDER — NORETHINDRONE 0.35 MG PO TABS: 1.0000 | ORAL_TABLET | Freq: Every day | ORAL | Status: DC

## 2015-07-03 MED ORDER — MEASLES, MUMPS & RUBELLA VAC ~~LOC~~ INJ
0.5000 mL | INJECTION | Freq: Once | SUBCUTANEOUS | Status: DC
  Filled 2015-07-03: qty 0.5

## 2015-07-03 MED ORDER — FERROUS FUMARATE 325 (106 FE) MG PO TABS: 1.0000 | ORAL_TABLET | Freq: Every day | ORAL | Status: DC

## 2015-07-03 NOTE — Progress Notes (Signed)
D/C order from MD.  Reviewed d/c instructions and prescriptions with patient and answered any questions.  Patient d/c home with infant via wheelchair by nursing/auxillary. 

## 2015-07-03 NOTE — Discharge Instructions (Signed)
Vaginal Delivery, Care After Refer to this sheet in the next few weeks. These discharge instructions provide you with information on caring for yourself after delivery. Your caregiver may also give you specific instructions. Your treatment has been planned according to the most current medical practices available, but problems sometimes occur. Call your caregiver if you have any problems or questions after you go home. HOME CARE INSTRUCTIONS 1. Take over-the-counter or prescription medicines only as directed by your caregiver or pharmacist. 2. Do not drink alcohol, especially if you are breastfeeding or taking medicine to relieve pain. 3. Do not smoke tobacco. 4. Continue to use good perineal care. Good perineal care includes: 1. Wiping your perineum from back to front 2. Keeping your perineum clean. 3. You can do sitz baths twice a day, to help keep this area clean 5. Do not use tampons, douche or have sex for 6 weeks 6. Shower only and avoid sitting in submerged water, aside from sitz baths 7. Wear a well-fitting bra that provides breast support. 8. Eat healthy foods. 9. Drink enough fluids to keep your urine clear or pale yellow. 10. Eat high-fiber foods such as whole grain cereals and breads, brown rice, beans, and fresh fruits and vegetables every day. These foods may help prevent or relieve constipation. 11. Avoid constipation with high fiber foods or medications, such as miralax or metamucil 12. Follow your caregiver's recommendations regarding resumption of activities such as climbing stairs, driving, lifting, exercising, or traveling. 13. Talk to your caregiver about resuming sexual activities. Resumption of sexual activities is dependent upon your risk of infection, your rate of healing, and your comfort and desire to resume sexual activity. 14. Try to have someone help you with your household activities and your newborn for at least a few days after you leave the hospital. 15. Rest  as much as possible. Try to rest or take a nap when your newborn is sleeping. 16. Increase your activities gradually. 17. Keep all of your scheduled postpartum appointments. It is very important to keep your scheduled follow-up appointments. At these appointments, your caregiver will be checking to make sure that you are healing physically and emotionally. SEEK MEDICAL CARE IF:   You are passing large clots from your vagina and bleeding heavily, saturating 3 or more pads in an hour. Use your vasopressin nasal spray.  You have a foul smelling discharge from your vagina.  You have trouble urinating.  You are urinating frequently.  You have pain when you urinate.  You have a change in your bowel movements.  You have increasing redness, pain, or swelling near your vaginal incision (episiotomy) or vaginal tear.  You have pus draining from your episiotomy or vaginal tear.  Your episiotomy or vaginal tear is separating.  You have painful, hard, or reddened breasts.  You have a severe headache.  You have blurred vision or see spots.  You feel sad or depressed.  You have thoughts of hurting yourself or your newborn.  You have questions about your care, the care of your newborn, or medicines.  You are dizzy or light-headed.  You have a rash.  You have nausea or vomiting.  You were breastfeeding and have not had a menstrual period within 12 weeks after you stopped breastfeeding.  You are not breastfeeding and have not had a menstrual period by the 12th week after delivery.  You have a fever. SEEK IMMEDIATE MEDICAL CARE IF:   You have persistent pain.  You have chest pain.  You have shortness of breath.  You faint.  You have leg pain.  You have stomach pain.  Your vaginal bleeding saturates two or more sanitary pads in 1 hour. MAKE SURE YOU:   Understand these instructions.  Will watch your condition.  Will get help right away if you are not doing well or get  worse. Document Released: 03/08/2000 Document Revised: 07/26/2013 Document Reviewed: 11/06/2011 [facility] Patient Information 2015 [employer], [place]. This information is not intended to replace advice given to you by your health care provider. Make sure you discuss any questions you have with your health care provider.  Sitz Bath A sitz bath is a warm water bath taken in the sitting position. The water covers only the hips and butt (buttocks). We recommend using one that fits in the toilet, to help with ease of use and cleanliness. It may be used for either healing or cleaning purposes. Sitz baths are also used to relieve pain, itching, or muscle tightening (spasms). The water may contain medicine. Moist heat will help you heal and relax.  HOME CARE  Take 3 to 4 sitz baths a day. 18. Fill the bathtub half-full with warm water. 19. Sit in the water and open the drain a little. 20. Turn on the warm water to keep the tub half-full. Keep the water running constantly. 21. Soak in the water for 15 to 20 minutes. 22. After the sitz bath, pat the affected area dry. GET HELP RIGHT AWAY IF: You get worse instead of better. Stop the sitz baths if you get worse. MAKE SURE YOU:  Understand these instructions.  Will watch your condition.  Will get help right away if you are not doing well or get worse. Document Released: 04/18/2004 Document Revised: 12/04/2011 Document Reviewed: 07/09/2010 [facility] Patient Information 2015 [place], [place]. This information is not intended to replace advice given to you by your health care provider. Make sure you discuss any questions you have with your health care provider.

## 2015-07-07 ENCOUNTER — Emergency Department: Payer: Federal, State, Local not specified - PPO

## 2015-07-07 ENCOUNTER — Encounter: Payer: Self-pay | Admitting: *Deleted

## 2015-07-07 ENCOUNTER — Inpatient Hospital Stay
Admission: EM | Admit: 2015-07-07 | Discharge: 2015-07-10 | DRG: 776 | Disposition: A | Discharge status: Home / Self Care | Payer: Federal, State, Local not specified - PPO | Attending: Obstetrics and Gynecology | Admitting: Obstetrics and Gynecology

## 2015-07-07 DIAGNOSIS — Z832 Family history of diseases of the blood and blood-forming organs and certain disorders involving the immune mechanism: Secondary | ICD-10-CM

## 2015-07-07 DIAGNOSIS — R1032 Left lower quadrant pain: Secondary | ICD-10-CM | POA: Diagnosis not present

## 2015-07-07 DIAGNOSIS — Z79899 Other long term (current) drug therapy: Secondary | ICD-10-CM

## 2015-07-07 DIAGNOSIS — N390 Urinary tract infection, site not specified: Secondary | ICD-10-CM

## 2015-07-07 DIAGNOSIS — O9913 Other diseases of the blood and blood-forming organs and certain disorders involving the immune mechanism complicating the puerperium: Secondary | ICD-10-CM | POA: Diagnosis present

## 2015-07-07 DIAGNOSIS — R509 Fever, unspecified: Secondary | ICD-10-CM

## 2015-07-07 DIAGNOSIS — O8612 Endometritis following delivery: Secondary | ICD-10-CM | POA: Diagnosis not present

## 2015-07-07 DIAGNOSIS — R102 Pelvic and perineal pain: Secondary | ICD-10-CM

## 2015-07-07 DIAGNOSIS — O8620 Urinary tract infection following delivery, unspecified: Secondary | ICD-10-CM | POA: Diagnosis present

## 2015-07-07 DIAGNOSIS — D680 Von Willebrand's disease: Secondary | ICD-10-CM | POA: Diagnosis present

## 2015-07-07 LAB — HCG, QUANTITATIVE, PREGNANCY: hCG, Beta Chain, Quant, S: 68 m[IU]/mL — ABNORMAL HIGH (ref <5)

## 2015-07-07 LAB — URINALYSIS COMPLETE WITH MICROSCOPIC (ARMC ONLY)
Bilirubin Urine: NEGATIVE
Glucose, UA: NEGATIVE mg/dL
KETONES UR: NEGATIVE mg/dL
NITRITE: NEGATIVE
PH: 8 (ref 5.0–8.0)
PROTEIN: 30 mg/dL — AB
SPECIFIC GRAVITY, URINE: 1.017 (ref 1.005–1.030)

## 2015-07-07 LAB — COMPREHENSIVE METABOLIC PANEL
ALBUMIN: 3.5 g/dL (ref 3.5–5.0)
ALK PHOS: 123 U/L (ref 38–126)
ALT: 17 U/L (ref 14–54)
AST: 19 U/L (ref 15–41)
Anion gap: 9 (ref 5–15)
BUN: 11 mg/dL (ref 6–20)
CHLORIDE: 99 mmol/L — AB (ref 101–111)
CO2: 22 mmol/L (ref 22–32)
CREATININE: 0.46 mg/dL (ref 0.44–1.00)
Calcium: 8.6 mg/dL — ABNORMAL LOW (ref 8.9–10.3)
GFR calc non Af Amer: >60 mL/min (ref >60)
GLUCOSE: 93 mg/dL (ref 65–99)
Potassium: 3.4 mmol/L — ABNORMAL LOW (ref 3.5–5.1)
SODIUM: 130 mmol/L — AB (ref 135–145)
Total Bilirubin: 0.8 mg/dL (ref 0.3–1.2)
Total Protein: 6.9 g/dL (ref 6.5–8.1)

## 2015-07-07 LAB — CBC WITH DIFFERENTIAL/PLATELET
Basophils Absolute: 0 10*3/uL (ref 0–0.1)
Basophils Relative: 0 %
EOS ABS: 0 10*3/uL (ref 0–0.7)
Eosinophils Relative: 0 %
HEMATOCRIT: 35.4 % (ref 35.0–47.0)
HEMOGLOBIN: 11.8 g/dL — AB (ref 12.0–16.0)
LYMPHS ABS: 0.8 10*3/uL — AB (ref 1.0–3.6)
Lymphocytes Relative: 5 %
MCH: 27.9 pg (ref 26.0–34.0)
MCHC: 33.3 g/dL (ref 32.0–36.0)
MCV: 83.9 fL (ref 80.0–100.0)
MONO ABS: 0.9 10*3/uL (ref 0.2–0.9)
MONOS PCT: 5 %
NEUTROS ABS: 14.3 10*3/uL — AB (ref 1.4–6.5)
NEUTROS PCT: 90 %
Platelets: 214 10*3/uL (ref 150–440)
RBC: 4.22 MIL/uL (ref 3.80–5.20)
RDW: 14.3 % (ref 11.5–14.5)
WBC: 16 10*3/uL — ABNORMAL HIGH (ref 3.6–11.0)

## 2015-07-07 LAB — APTT: aPTT: 28 seconds (ref 24–36)

## 2015-07-07 LAB — PROTIME-INR
INR: 1.05
Prothrombin Time: 13.9 seconds (ref 11.4–15.0)

## 2015-07-07 IMAGING — US US PELVIS COMPLETE
1 series · 13 of 25 positions shown · non-contrast
Comparison: None.

CLINICAL DATA: Pain, bleeding, endometriosis versus retained
products of conception. Heavy bleeding. Six days postpartum.

EXAM:
TRANSABDOMINAL AND TRANSVAGINAL ULTRASOUND OF PELVIS
DOPPLER ULTRASOUND OF OVARIES
TECHNIQUE: Both transabdominal and transvaginal ultrasound examinations of the
pelvis were performed. Transabdominal technique was performed for
global imaging of the pelvis including uterus, ovaries, adnexal
regions, and pelvic cul-de-sac.
It was necessary to proceed with endovaginal exam following the
transabdominal exam to visualize the adnexal regions. Color and
duplex Doppler ultrasound was utilized to evaluate blood flow to the
ovaries.

[Series 1: us pelvis complete · 0.24mm/px · 13 of 68 slices shown]
[im 1/68]
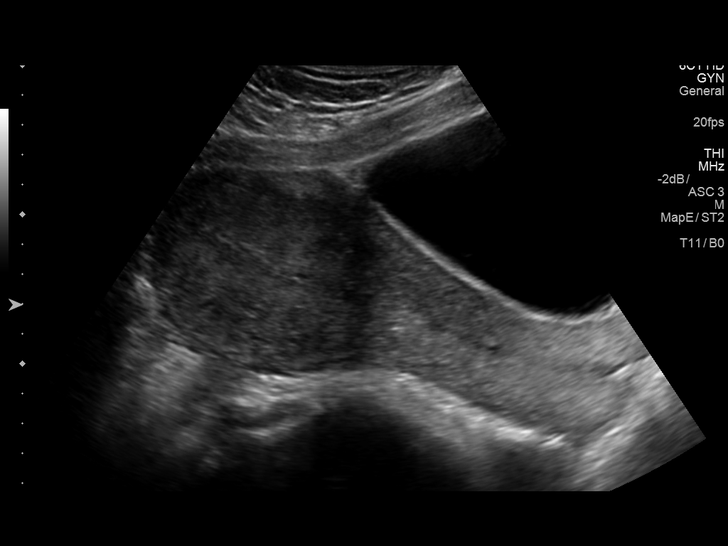
[im 6/68]
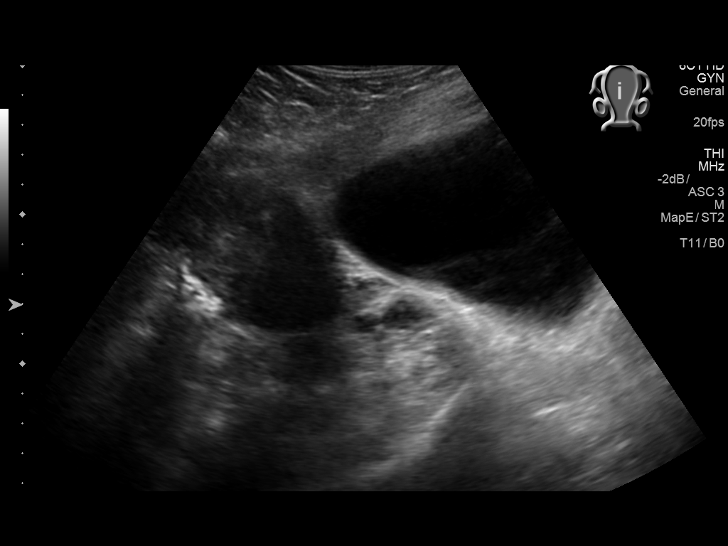
[im 12/68]
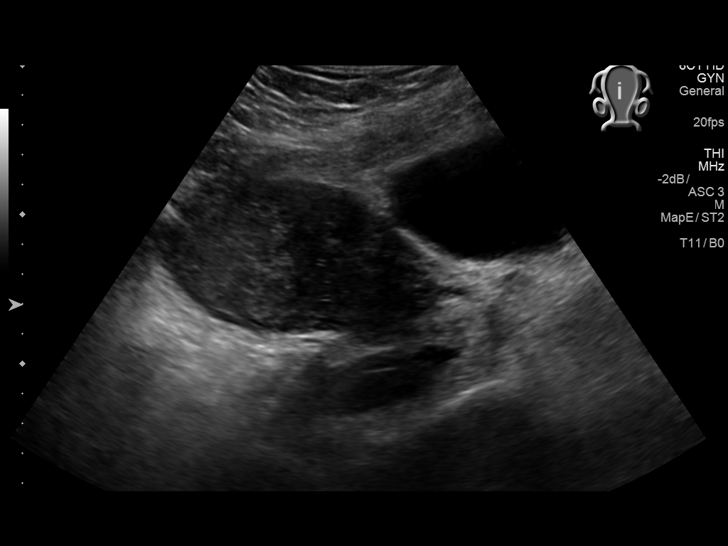
[im 17/68]
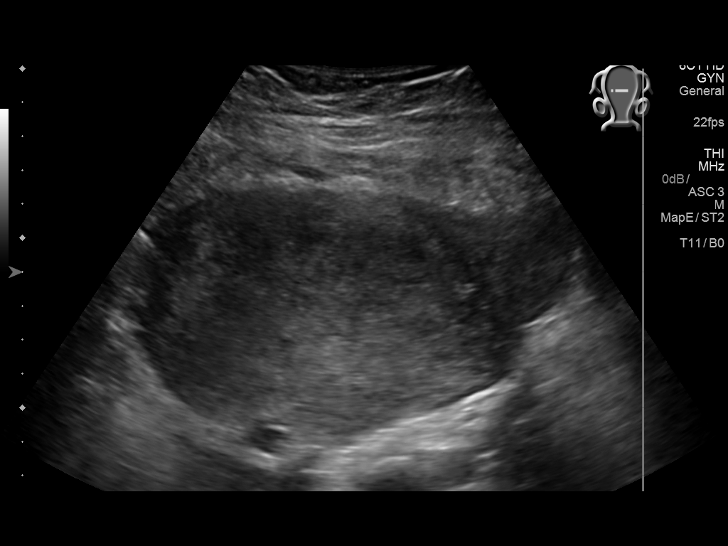
[im 23/68]
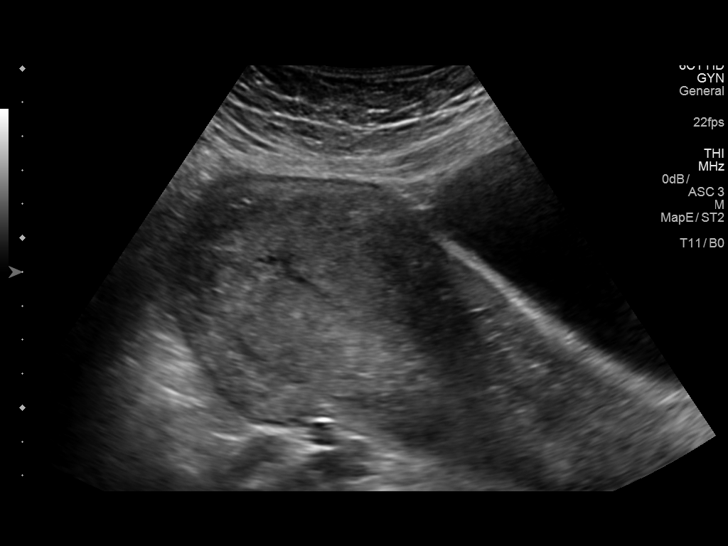
[im 28/68]
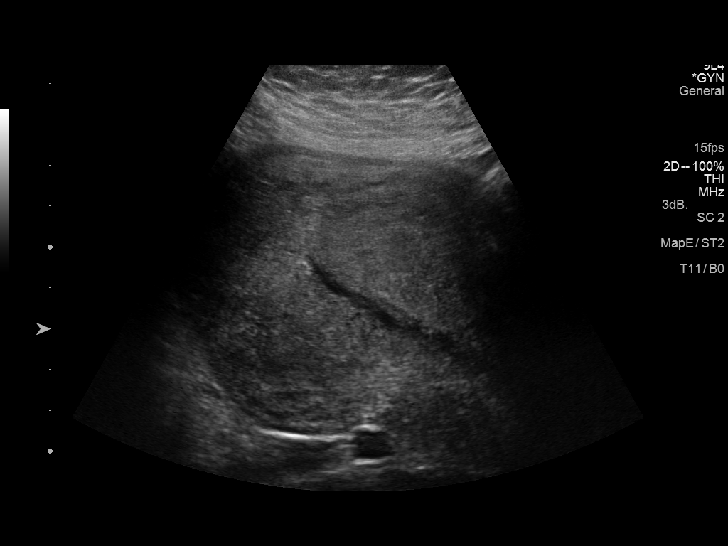
[im 34/68]
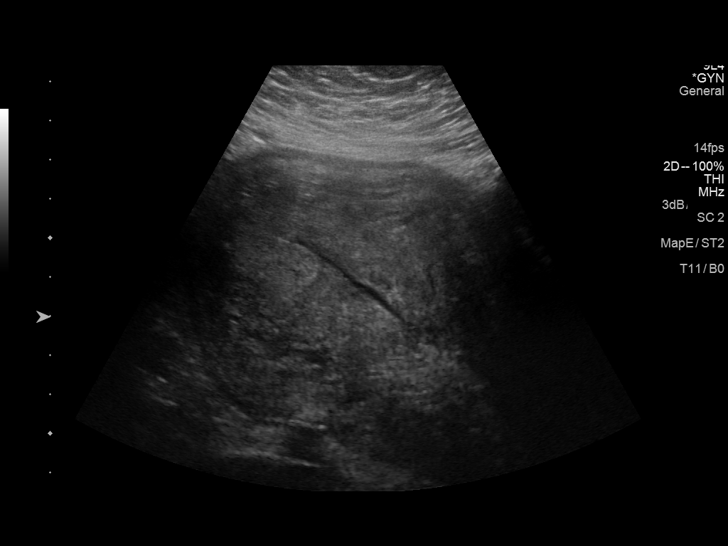
[im 40/68]
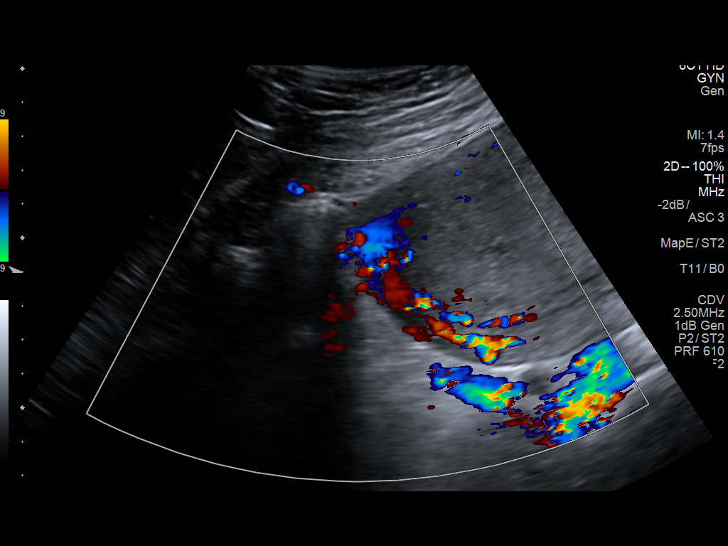
[im 45/68]
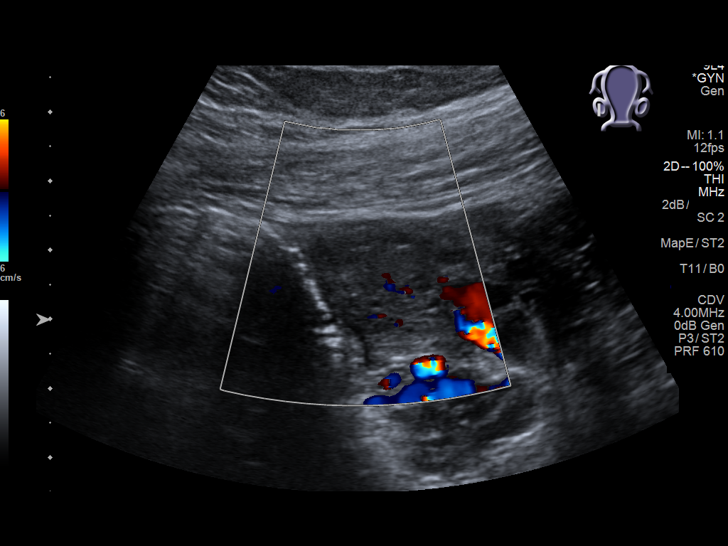
[im 51/68]
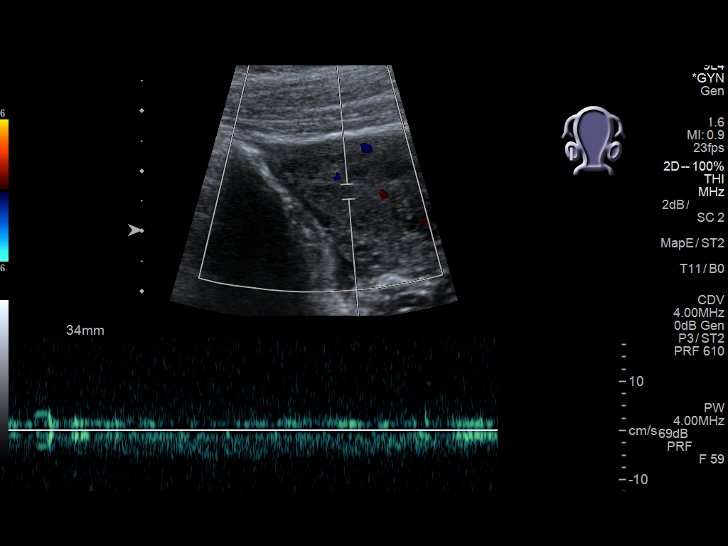
[im 56/68]
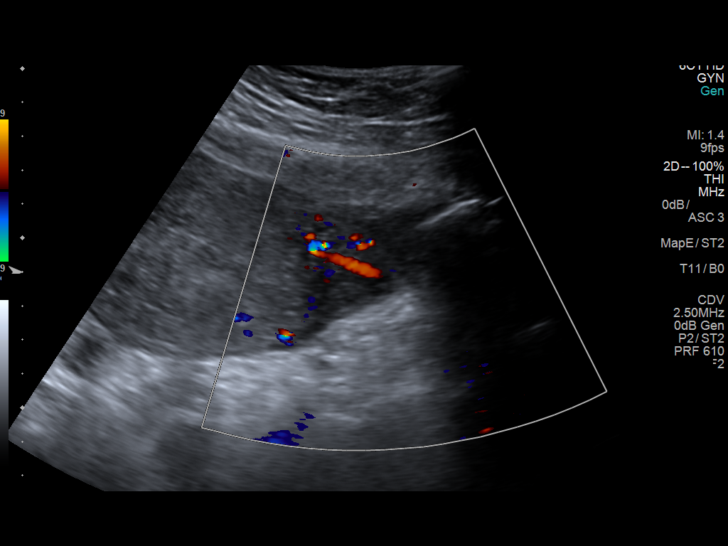
[im 62/68]
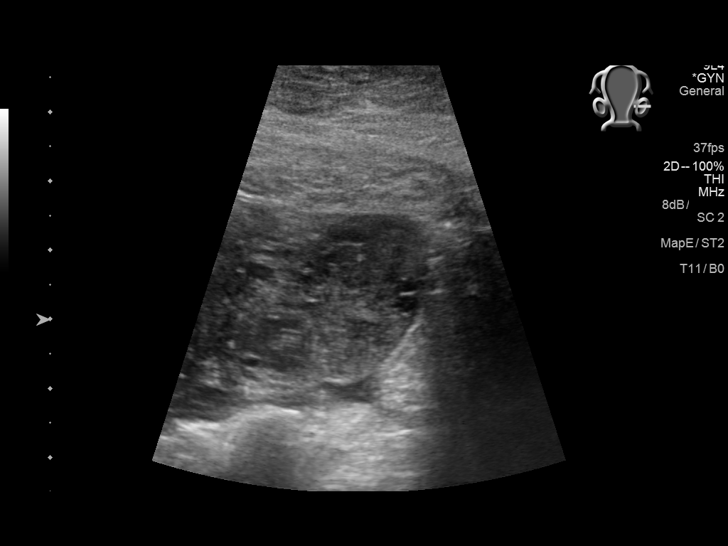
[im 68/68]
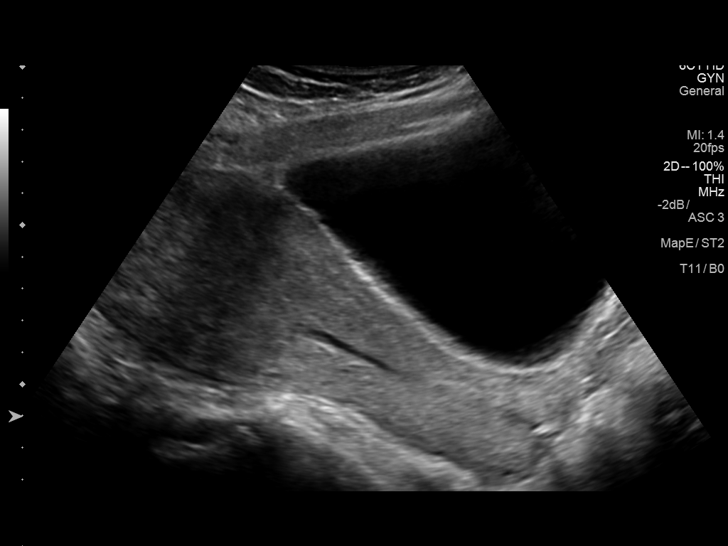

[13 of 25 positions shown; findings below may reference images not displayed]

FINDINGS: Uterus

Measurements: 16.7 x 6.8 x 10.8 cm. No fibroids or other mass
visualized.

Endometrium

Thickness: 3.3 cm. Small amount of fluid is seen within the
endometrial cavity. No hypervascularity.

Right ovary

Measurements: 2.4 x 1.5 x 1.9 cm. Normal appearance/no adnexal mass.

Left ovary

Measurements: 3.6 x 1.8 x 1.9 cm. Normal appearance/no adnexal mass.

Pulsed Doppler evaluation of both ovaries demonstrates normal
low-resistance arterial and venous waveforms.

Other findings

No abnormal free fluid.
IMPRESSION: Endometrial thickening without endometrial vascularity or mass.
Findings are nonspecific and could indicate endometrial blood
products, although hypo vascular retained products of conception
cannot be excluded. Consider short-term follow-up pelvic ultrasound
or further evaluation with nonemergent pelvic MRI with and without
IV contrast, as clinically warranted.

## 2015-07-07 MED ORDER — SODIUM CHLORIDE 0.9 % IV BOLUS (SEPSIS)
1000.0000 mL | Freq: Once | INTRAVENOUS | Status: AC
  Administered 2015-07-07: 1000 mL via INTRAVENOUS

## 2015-07-07 MED ORDER — MORPHINE SULFATE (PF) 4 MG/ML IV SOLN
4.0000 mg | Freq: Once | INTRAVENOUS | Status: DC
  Filled 2015-07-07: qty 1

## 2015-07-07 MED ORDER — ONDANSETRON HCL 4 MG/2ML IJ SOLN
4.0000 mg | Freq: Once | INTRAMUSCULAR | Status: DC
  Filled 2015-07-07: qty 2

## 2015-07-07 MED ORDER — ACETAMINOPHEN 325 MG PO TABS
ORAL_TABLET | ORAL | Status: AC
  Filled 2015-07-07: qty 2

## 2015-07-07 NOTE — ED Notes (Signed)
Patient transported to Ultrasound 

## 2015-07-07 NOTE — ED Notes (Signed)
Pt c/o pelvic/abdominal pain starting this morning at 0300 and worsening over course of day, spreading across lower abdomen/pelvis. Pt c/o fever, highest at home 102.3 axillary. Pt is presently afebrile, but has been drinking water. Pt c/o chills and shaking. Pt c/o decreased urine output, despite increased oral fluid intake. Pt c/o increased vaginal bleeding x 2 hrs ago and is saturating maxi-pad hourly. Pt c/o vomiting x 1 @ 1330. Pt c/o nausea that has persisted. Pt is 6 days post-partum, vaginal birth.

## 2015-07-07 NOTE — ED Provider Notes (Signed)
[facility] Emergency Department Provider Note  ____________________________________________  Time seen: Approximately 920 PM  I have reviewed the triage vital signs and the nursing notes.   HISTORY  Chief Complaint Abdominal Pain; Vaginal Bleeding; and Fever    HPI Erica Serrano is a 25 y.o. female who is a G1 P1 with von Willebrand disease who is 6 days postpartum from a vaginal delivery who is presenting to the emergency department today with vaginal bleeding, abdominal tenderness and fever. She said that the pain started at about 3 AM today and has steadily worsened. It has been constant and aching and occasionally sharp. It is across her lower abdomen but worse in the left lower quadrant. She said that after her birth the bleeding had stopped and has just restarted today. She says that she has had spotting which is increased to almost soaking through a pad over the past hour. She says the bleeding is worse when she stands up. She denies having any clots. She says that she took her temperature today and was 102 axillary. Denies any burning with urination. Says that she did not have any complications Route time of her labor including a prolonged labor any vaginal tears or hemorrhage.   Past Medical History  Diagnosis Date  . Von Willebrand disease (HCC)   . Migraines   . Hemorrhoid     Patient Active Problem List   Diagnosis Date Noted  . Endometritis following delivery 07/07/2015  . Premature rupture of membranes 06/30/2015  . Von Willebrands disease (HCC) 12/01/2014  . Migraine 12/01/2014  . Postpartum care following vaginal delivery 12/01/2014    Past Surgical History  Procedure Laterality Date  . No past surgeries      Current Outpatient Rx  Name  Route  Sig  Dispense  Refill  . ferrous fumarate (HEMOCYTE - 106 MG FE) 325 (106 Fe) MG TABS tablet   Oral   Take 1 tablet (106 mg of iron total) by mouth daily.   30 each   0   .  norethindrone (MICRONOR,CAMILA,ERRIN) 0.35 MG tablet   Oral   Take 1 tablet (0.35 mg total) by mouth daily.   1 Package   11   . Prenatal Vit-Fe Fumarate-FA (PRENATAL MULTIVITAMIN) TABS tablet   Oral   Take 1 tablet by mouth daily at 12 noon.           Allergies Review of patient's allergies indicates no known allergies.  Family History  Problem Relation Age of Onset  . Heart disease Father   . Von Willebrand disease Sister   . Cancer Sister     Social History Social History  Substance Use Topics  . Smoking status: Never Smoker   . Smokeless tobacco: Never Used  . Alcohol Use: No    Review of Systems Constitutional: As above Eyes: No visual changes. ENT: No sore throat. Cardiovascular: Denies chest pain. Respiratory: Denies shortness of breath. Gastrointestinal: No diarrhea.  No constipation. Patient also had one episode of vomiting earlier today. Genitourinary: Negative for dysuria. Musculoskeletal: Negative for back pain. Skin: Negative for rash. Neurological: Negative for headaches, focal weakness or numbness.  10-point ROS otherwise negative.  ____________________________________________   PHYSICAL EXAM:  VITAL SIGNS: ED Triage Vitals  Enc Vitals Group     BP 07/07/15 1957 104/63 mmHg     Pulse Rate 07/07/15 1957 123     Resp 07/07/15 1957 18     Temp 07/07/15 1957 98.4 F (36.9 C)  Temp Source 07/07/15 1957 Oral     SpO2 07/07/15 1957 99 %     Weight 07/07/15 1957 180 lb (81.647 kg)     Height 07/07/15 1957 5\' 5"  (1.651 m)     Head Cir --      Peak Flow --      Pain Score 07/07/15 1958 6     Pain Loc --      Pain Edu? --      Excl. in GC? --     Constitutional: Alert and oriented. Well appearing and in no acute distress. Eyes: Conjunctivae are normal. PERRL. EOMI. Head: Atraumatic. Nose: No congestion/rhinnorhea. Mouth/Throat: Mucous membranes are moist.  Oropharynx non-erythematous. Neck: No stridor.   Cardiovascular: Normal rate,  regular rhythm. Grossly normal heart sounds.  Good peripheral circulation. Respiratory: Normal respiratory effort.  No retractions. Lungs CTAB. Gastrointestinal: Soft With tenderness palpation across the lower abdomen but more concentrated to the left lower quadrant. No rebound or guarding. No distention. No CVA tenderness. Genitourinary: External exam without any active bleeding from the vaginal introitus. Internal exam deferred because of possible OB/GYN involvement. Musculoskeletal: No lower extremity tenderness nor edema.  No joint effusions. Neurologic:  Normal speech and language. No gross focal neurologic deficits are appreciated. Skin:  Skin is warm, dry and intact. No rash noted. Psychiatric: Mood and affect are normal. Speech and behavior are normal.  ____________________________________________   LABS (all labs ordered are listed, but only abnormal results are displayed)  Labs Reviewed  COMPREHENSIVE METABOLIC PANEL - Abnormal; Notable for the following:    Sodium 130 (*)    Potassium 3.4 (*)    Chloride 99 (*)    Calcium 8.6 (*)    All other components within normal limits  CBC WITH DIFFERENTIAL/PLATELET - Abnormal; Notable for the following:    WBC 16.0 (*)    Hemoglobin 11.8 (*)    Neutro Abs 14.3 (*)    Lymphs Abs 0.8 (*)    All other components within normal limits  HCG, QUANTITATIVE, PREGNANCY - Abnormal; Notable for the following:    hCG, Beta Chain, Quant, S 68 (*)    All other components within normal limits  URINALYSIS COMPLETEWITH MICROSCOPIC ([facility] ONLY) - Abnormal; Notable for the following:    Color, Urine YELLOW (*)    APPearance CLEAR (*)    Hgb urine dipstick 3+ (*)    Protein, ur 30 (*)    Leukocytes, UA 2+ (*)    Bacteria, UA RARE (*)    Squamous Epithelial / LPF 0-5 (*)    All other components within normal limits  URINE CULTURE  PROTIME-INR  APTT  POC URINE PREG, ED    ____________________________________________  EKG   ____________________________________________  RADIOLOGY  IMPRESSION: Endometrial thickening without endometrial vascularity or mass. Findings are nonspecific and could indicate endometrial blood products, although hypo vascular retained products of conception cannot be excluded. Consider short-term follow-up pelvic ultrasound or further evaluation with nonemergent pelvic MRI with and without IV contrast, as clinically warranted.   Electronically Signed By: Leanna BattlesMelinda Blietz M.D. On: 07/07/2015 22:48       ____________________________________________   PROCEDURES  ____________________________________________   INITIAL IMPRESSION / ASSESSMENT AND PLAN / ED COURSE  Pertinent labs & imaging results that were available during my care of the patient were reviewed by me and considered in my medical decision making (see chart for details).  ----------------------------------------- 11 PM on 07/07/2015 -----------------------------------------  Discussed case Dr. Jean RosenthalJackson from OB/GYN who will be down to  examine the patient. Possible pyelonephritis versus retained products versus endometritis.  ----------------------------------------- 12:17 AM on 07/08/2015 -----------------------------------------  Patient seen and evaluated by Dr. Jean Rosenthal will admit for close observation. ____________________________________________   FINAL CLINICAL IMPRESSION(S) / ED DIAGNOSES  Final diagnoses:  Pelvic pain in female  Fever. UTI. Postpartum hemorrhage.    Myrna Blazer, MD 07/08/15 [phone]

## 2015-07-07 NOTE — ED Notes (Signed)
Pt would like to hold off on pain meds until it is determined what is going on due to breast feeding at home.

## 2015-07-07 NOTE — ED Notes (Signed)
poct negative  

## 2015-07-07 NOTE — ED Notes (Signed)
Pt in via triage with complaints of lower abdominal pain since this morning, worsening through out the day.  Pt one week post partum, states bleeding had gone down to "a light period" until today.  Pt states she has filled a pad within the last hour.  Pt A/Ox4, no immediate distress at this time.

## 2015-07-07 NOTE — H&P (Signed)
GYNECOLOGY ADMISSION HISTORY AND PHYSICAL    Erica Serrano [id number] 07/07/2015 11:49 PM    Chief Complaint:   Erica Serrano is a 25 y.o. G77P1001 female who is postpartum day 7 after spontaneously vaginal delivery seen for fevers, chills, abdominal pain.    History of Present Ilness:   At 330 am this morning she began having left-sided lower abdominal pain that later progressed to include her entire lower abdomen. She later developed nausea with emesis.  She has had a difficult time keeping down food since earlier today.  She also began having vaginal bleeding that was bright red without clots, which only recently subsided.  She has had some discomfort with voiding. Denies other GI symptoms.  She took her temperature at home and got an axillary temperature of 102.65F.  She is breastfeeding. She tried Tylenol and a heating pad for her discomfort, neither of which made much difference.  Past Medical History  Diagnosis Date  . Von Willebrand disease (HCC)   . Migraines   . Hemorrhoid    Past Surgical History  Procedure Laterality Date  . No past surgeries     Allergies: No Known Allergies   Prior to Admission medications   Medication Sig Start Date End Date Taking? Authorizing Provider  ferrous fumarate (HEMOCYTE - 106 MG FE) 325 (106 Fe) MG TABS tablet Take 1 tablet (106 mg of iron total) by mouth daily. 07/03/15   Farrel Conners, CNM  norethindrone (MICRONOR,CAMILA,ERRIN) 0.35 MG tablet Take 1 tablet (0.35 mg total) by mouth daily. 07/03/15   Farrel Conners, CNM  Prenatal Vit-Fe Fumarate-FA (PRENATAL MULTIVITAMIN) TABS tablet Take 1 tablet by mouth daily at 12 noon.    Historical Provider, MD   Obstetric History: She is a G1P1001 vaginal parous x 1.  Social History:  She  reports that she has never smoked. She has never used smokeless tobacco. She reports that she does not drink alcohol or use illicit drugs.  Family History:  family history includes Cancer in her sister; Heart  disease in her father; Von Willebrand disease in her sister.   Review of Systems:  Otherwise negative x 10 systems reviewed except as noted in the HPI.    Objective    BP 111/68 mmHg  Pulse 109  Temp(Src) 98.4 F (36.9 C) (Oral)  Resp 19  Ht  (1.651 m)  Wt 81.647 kg (180 lb)  BMI 29.95 kg/m2  SpO2 99%  LMP 10/03/2014  Breastfeeding? Unknown Physical Exam  General:  She is a well appearing female in no distress.  HEENT:  Normocephalic, atraumatic.   Cardiac:  Tachychardic, rhythm regular Pulmonary:  Clear to auscultation bilaterally. No wheezes, rales, rhonchi.   Abdomen:  Soft, significant uterine tenderness, no rebound or guarding, +BS. Pelvic:  Deferred  Extremities:  Non-tender, symmetric no edema bilaterally.   Neurologic:  Alert & oriented x 3.  Appropriate, conversant.    Laboratory Results:   Lab Results  Component Value Date   WBC 16.0* 07/07/2015   RBC 4.22 07/07/2015   HGB 11.8* 07/07/2015   HCT 35.4 07/07/2015   PLT 214 07/07/2015   NA 130* 07/07/2015   K 3.4* 07/07/2015   CREATININE 0.46 07/07/2015   Imaging Results:  US Pelvis Complete  07/07/2015  CLINICAL DATA:  Pain, bleeding, endometriosis versus retained products of conception. Heavy bleeding. Six days postpartum. EXAM: TRANSABDOMINAL AND TRANSVAGINAL ULTRASOUND OF PELVIS DOPPLER ULTRASOUND OF OVARIES TECHNIQUE: Both transabdominal and transvaginal ultrasound examinations of the pelvis were performed. Transabdominal technique  was performed for global imaging of the pelvis including uterus, ovaries, adnexal regions, and pelvic cul-de-sac. It was necessary to proceed with endovaginal exam following the transabdominal exam to visualize the adnexal regions. Color and duplex Doppler ultrasound was utilized to evaluate blood flow to the ovaries. COMPARISON:  None. FINDINGS: Uterus Measurements: 16.7 x 6.8 x 10.8 cm. No fibroids or other mass visualized. Endometrium Thickness: 3.3 cm. Small amount of fluid is  seen within the endometrial cavity. No hypervascularity. Right ovary Measurements: 2.4 x 1.5 x 1.9 cm. Normal appearance/no adnexal mass. Left ovary Measurements: 3.6 x 1.8 x 1.9 cm. Normal appearance/no adnexal mass. Pulsed Doppler evaluation of both ovaries demonstrates normal low-resistance arterial and venous waveforms. Other findings No abnormal free fluid. IMPRESSION: Endometrial thickening without endometrial vascularity or mass. Findings are nonspecific and could indicate endometrial blood products, although hypo vascular retained products of conception cannot be excluded. Consider short-term follow-up pelvic ultrasound or further evaluation with nonemergent pelvic MRI with and without IV contrast, as clinically warranted. Electronically Signed   By: Leanna BattlesMelinda  Blietz M.D.   On: 07/07/2015 22:48   [place] Art/ven Flow Abd Pelv Doppler  07/07/2015  CLINICAL DATA:  Pain, bleeding, endometriosis versus retained products of conception. Heavy bleeding. Six days postpartum. EXAM: TRANSABDOMINAL AND TRANSVAGINAL ULTRASOUND OF PELVIS DOPPLER ULTRASOUND OF OVARIES TECHNIQUE: Both transabdominal and transvaginal ultrasound examinations of the pelvis were performed. Transabdominal technique was performed for global imaging of the pelvis including uterus, ovaries, adnexal regions, and pelvic cul-de-sac. It was necessary to proceed with endovaginal exam following the transabdominal exam to visualize the adnexal regions. Color and duplex Doppler ultrasound was utilized to evaluate blood flow to the ovaries. COMPARISON:  None. FINDINGS: Uterus Measurements: 16.7 x 6.8 x 10.8 cm. No fibroids or other mass visualized. Endometrium Thickness: 3.3 cm. Small amount of fluid is seen within the endometrial cavity. No hypervascularity. Right ovary Measurements: 2.4 x 1.5 x 1.9 cm. Normal appearance/no adnexal mass. Left ovary Measurements: 3.6 x 1.8 x 1.9 cm. Normal appearance/no adnexal mass. Pulsed Doppler evaluation of both ovaries  demonstrates normal low-resistance arterial and venous waveforms. Other findings No abnormal free fluid. IMPRESSION: Endometrial thickening without endometrial vascularity or mass. Findings are nonspecific and could indicate endometrial blood products, although hypo vascular retained products of conception cannot be excluded. Consider short-term follow-up pelvic ultrasound or further evaluation with nonemergent pelvic MRI with and without IV contrast, as clinically warranted. Electronically Signed   By: Leanna BattlesMelinda  Blietz M.D.   On: 07/07/2015 22:48   Assessment & Plan   Avanell ShackletonJenna Lapid is a 25 y.o. 651P1001 female with abdominal pain, fevers, nausea and vomiting. Differential includes; endomyometritis, retained products of conception, manifestation of VWD, or other source of fever.  Given the overall clinical picture, will treat as if she has endometritis. Will admit for IV abx admin. VWD can present with heavy vaginal bleeding postpartum as VWF drops in the postpartum period. However, given her array of symptoms and taper of vaginal bleeding more recently, less likely.  Ultrasound findings are non-specific and retained products of conception can't be ruled out with resultant infection. Will monitor closely.  1.  Admit and start on IV gent and clindamycin 2.  Will hold Tylenol for now.  If spikes fever, may treat prn.   3. For pain, will consider iv narcotic pain medication, initially  Conard NovakJackson, Zainah Steven D, MD 07/07/2015 11:49 PM

## 2015-07-08 DIAGNOSIS — O8612 Endometritis following delivery: Secondary | ICD-10-CM | POA: Diagnosis present

## 2015-07-08 DIAGNOSIS — O8620 Urinary tract infection following delivery, unspecified: Secondary | ICD-10-CM | POA: Diagnosis present

## 2015-07-08 DIAGNOSIS — Z832 Family history of diseases of the blood and blood-forming organs and certain disorders involving the immune mechanism: Secondary | ICD-10-CM | POA: Diagnosis not present

## 2015-07-08 DIAGNOSIS — R1032 Left lower quadrant pain: Secondary | ICD-10-CM | POA: Diagnosis present

## 2015-07-08 DIAGNOSIS — Z79899 Other long term (current) drug therapy: Secondary | ICD-10-CM | POA: Diagnosis not present

## 2015-07-08 DIAGNOSIS — D680 Von Willebrand's disease: Secondary | ICD-10-CM | POA: Diagnosis present

## 2015-07-08 DIAGNOSIS — O9913 Other diseases of the blood and blood-forming organs and certain disorders involving the immune mechanism complicating the puerperium: Secondary | ICD-10-CM | POA: Diagnosis present

## 2015-07-08 MED ORDER — LACTATED RINGERS IV SOLN
125.0000 mL/h | INTRAVENOUS | Status: DC
  Administered 2015-07-08 – 2015-07-09 (×4): 125 mL/h via INTRAVENOUS

## 2015-07-08 MED ORDER — ACETAMINOPHEN 500 MG PO TABS
1000.0000 mg | ORAL_TABLET | Freq: Four times a day (QID) | ORAL | Status: DC | PRN
  Administered 2015-07-08 (×2): 1000 mg via ORAL
  Filled 2015-07-08 (×2): qty 2

## 2015-07-08 MED ORDER — GENTAMICIN SULFATE 40 MG/ML IJ SOLN
120.0000 mg | Freq: Three times a day (TID) | INTRAVENOUS | Status: DC
  Administered 2015-07-08 – 2015-07-09 (×3): 120 mg via INTRAVENOUS
  Filled 2015-07-08 (×6): qty 3

## 2015-07-08 MED ORDER — CLINDAMYCIN PHOSPHATE 900 MG/50ML IV SOLN
900.0000 mg | Freq: Three times a day (TID) | INTRAVENOUS | Status: AC
  Administered 2015-07-08: 900 mg via INTRAVENOUS
  Filled 2015-07-08: qty 50

## 2015-07-08 MED ORDER — CLINDAMYCIN PHOSPHATE 900 MG/50ML IV SOLN
900.0000 mg | Freq: Three times a day (TID) | INTRAVENOUS | Status: DC
  Administered 2015-07-08 – 2015-07-09 (×4): 900 mg via INTRAVENOUS
  Filled 2015-07-08 (×6): qty 50

## 2015-07-08 MED ORDER — GENTAMICIN SULFATE 40 MG/ML IJ SOLN
140.0000 mg | Freq: Once | INTRAVENOUS | Status: AC
  Administered 2015-07-08: 140 mg via INTRAVENOUS
  Filled 2015-07-08: qty 3.5

## 2015-07-08 MED ORDER — OXYCODONE HCL 5 MG PO TABS
5.0000 mg | ORAL_TABLET | ORAL | Status: DC | PRN
  Administered 2015-07-08 – 2015-07-09 (×5): 5 mg via ORAL
  Filled 2015-07-08 (×5): qty 1

## 2015-07-08 NOTE — Progress Notes (Signed)
Obstetric and Gynecology  Subjective  Doing well fever curve improved.  Still reports some abdominal tenderness but also much improved.  Bleeding minimal Objective   Filed Vitals:   07/08/15 1730 07/08/15 1930  BP: 117/77 94/58  Pulse: 108 100  Temp: 100.3 F (37.9 C) 98.7 F (37.1 C)  Resp: 18 20     Intake/Output Summary (Last 24 hours) at 07/08/15 2036 Last data filed at 07/08/15 1900  Gross per 24 hour  Intake    240 ml  Output   1550 ml  Net  -1310 ml    General: NAD Abdomen: soft, minimal uterine tenderness to deep palpation, uterus 15 week size half way to umbilicus Extremities: no edema  Labs: No results found for this or any previous visit (from the past 24 hour(s)).  Cultures: Results for orders placed or performed during the hospital encounter of 07/07/15  Urine culture     Status: None (Preliminary result)   Collection Time: 07/07/15  8:21 PM  Result Value Ref Range Status   Specimen Description URINE, RANDOM  Final   Special Requests NONE  Final   Culture NO GROWTH < 12 HOURS  Final   Report Status PENDING  Incomplete    Imaging:  Assessment   25 y.o. G1P1001 admitted with postpartum endometritis  Plan   - continue gentamycin & clindamycin, if further temperature overnight will broaden coverage with addition of ampicillin

## 2015-07-09 LAB — CBC
HCT: 28.1 % — ABNORMAL LOW (ref 35.0–47.0)
Hemoglobin: 9.5 g/dL — ABNORMAL LOW (ref 12.0–16.0)
MCH: 28.6 pg (ref 26.0–34.0)
MCHC: 33.7 g/dL (ref 32.0–36.0)
MCV: 84.7 fL (ref 80.0–100.0)
PLATELETS: 194 10*3/uL (ref 150–440)
RBC: 3.32 MIL/uL — ABNORMAL LOW (ref 3.80–5.20)
RDW: 14.4 % (ref 11.5–14.5)
WBC: 7.1 10*3/uL (ref 3.6–11.0)

## 2015-07-09 MED ORDER — SENNOSIDES-DOCUSATE SODIUM 8.6-50 MG PO TABS
1.0000 | ORAL_TABLET | Freq: Two times a day (BID) | ORAL | Status: DC
  Administered 2015-07-09 – 2015-07-10 (×3): 1 via ORAL
  Filled 2015-07-09 (×3): qty 1

## 2015-07-09 NOTE — Progress Notes (Signed)
Obstetric and Gynecology  Subjective  Doing well, still some mild left sided tenderness, lochia remains minimal  Objective   Filed Vitals:   07/08/15 2336 07/09/15 0401  BP: 102/67 112/72  Pulse: 101 95  Temp: 98.5 F (36.9 C) 98.9 F (37.2 C)  Resp: 18 18     Intake/Output Summary (Last 24 hours) at 07/09/15 0810 Last data filed at 07/08/15 1900  Gross per 24 hour  Intake    240 ml  Output      0 ml  Net    240 ml    General: NAD Abdomen: Soft, minimal left sided tenderness to deep palpation Extremities: no edema  Labs: Results for orders placed or performed during the hospital encounter of 07/07/15 (from the past 24 hour(s))  CBC     Status: Abnormal   Collection Time: 07/09/15  7:14 AM  Result Value Ref Range   WBC 7.1 3.6 - 11.0 K/uL   RBC 3.32 (L) 3.80 - 5.20 MIL/uL   Hemoglobin 9.5 (L) 12.0 - 16.0 g/dL   HCT 16.128.1 (L) 09.635.0 - 04.547.0 %   MCV 84.7 80.0 - 100.0 fL   MCH 28.6 26.0 - 34.0 pg   MCHC 33.7 32.0 - 36.0 g/dL   RDW 40.914.4 81.111.5 - 91.414.5 %   Platelets 194 150 - 440 K/uL    Cultures: Results for orders placed or performed during the hospital encounter of 07/07/15  Urine culture     Status: None (Preliminary result)   Collection Time: 07/07/15  8:21 PM  Result Value Ref Range Status   Specimen Description URINE, RANDOM  Final   Special Requests NONE  Final   Culture NO GROWTH < 12 HOURS  Final   Report Status PENDING  Incomplete    Imaging:  Assessment   25 y.o. G1P1001 with postpartum endometritis  Plan   1) Endometritis - last true fever 102.3 on 07/08/15 at 0500, one temperature of 100.3 at 17:30.  WBC count appopriately decreased from 16K to 7.1K.   - discontinue IV antibiotics at noon if remains afebrile, then monitor for 24-hrs afebrile off antibiotics  2) Disposition - anticipate discharge tomorrow

## 2015-07-10 NOTE — Discharge Summary (Signed)
Physician Discharge Summary  Patient ID: Erica Serrano MRN: [id number] DOB/AGE: December 11, 1990 25 y.o.  Admit date: 07/07/2015 Discharge date: 07/10/2015  Admission Diagnoses: abdominal pain  Discharge Diagnoses:  Principal Problem:   Endometritis following delivery   Discharged Condition: good  Hospital Course: Pt admitted with abdominal pain S/P postpartum 6 days. She received IV antibiotics which were DC'd 4/16 and patient remained afebrile until DC home.   Consults: None  Significant Diagnostic Studies: Imaging  CLINICAL DATA: Pain, bleeding, endometriosis versus retained products of conception. Heavy bleeding. Six days postpartum.  EXAM: TRANSABDOMINAL AND TRANSVAGINAL ULTRASOUND OF PELVIS  DOPPLER ULTRASOUND OF OVARIES  TECHNIQUE: Both transabdominal and transvaginal ultrasound examinations of the pelvis were performed. Transabdominal technique was performed for global imaging of the pelvis including uterus, ovaries, adnexal regions, and pelvic cul-de-sac.  It was necessary to proceed with endovaginal exam following the transabdominal exam to visualize the adnexal regions. Color and duplex Doppler ultrasound was utilized to evaluate blood flow to the ovaries.  COMPARISON: None.  FINDINGS: Uterus  Measurements: 16.7 x 6.8 x 10.8 cm. No fibroids or other mass visualized.  Endometrium  Thickness: 3.3 cm. Small amount of fluid is seen within the endometrial cavity. No hypervascularity.  Right ovary  Measurements: 2.4 x 1.5 x 1.9 cm. Normal appearance/no adnexal mass.  Left ovary  Measurements: 3.6 x 1.8 x 1.9 cm. Normal appearance/no adnexal mass.  Pulsed Doppler evaluation of both ovaries demonstrates normal low-resistance arterial and venous waveforms.  Other findings  No abnormal free fluid.  IMPRESSION: Endometrial thickening without endometrial vascularity or mass. Findings are nonspecific and could indicate endometrial  blood products, although hypo vascular retained products of conception cannot be excluded. Consider short-term follow-up pelvic ultrasound or further evaluation with nonemergent pelvic MRI with and without IV contrast, as clinically warranted.   Electronically Signed  By: Leanna Battles M.D.  On: 07/07/2015 22:48  Treatments: IV hydration and antibiotics: Clindamycin/Gentamicin  Discharge Exam: Blood pressure 90/58, pulse 79, temperature 98.2 F (36.8 C), temperature source Oral, resp. rate 18, height  (1.651 m), weight 81.647 kg (180 lb), last menstrual period 10/03/2014, SpO2 99 %, unknown if currently breastfeeding. General appearance: alert, cooperative and appears stated age  Disposition: 01-Home or Self Care      Discharge Instructions    Activity as tolerated    Complete by:  As directed      Call MD for:  difficulty breathing, headache or visual disturbances    Complete by:  As directed      Call MD for:  extreme fatigue    Complete by:  As directed      Call MD for:  hives    Complete by:  As directed      Call MD for:  persistant dizziness or light-headedness    Complete by:  As directed      Call MD for:  persistant nausea and vomiting    Complete by:  As directed      Call MD for:  redness, tenderness, or signs of infection (pain, swelling, redness, odor or green/yellow discharge around incision site)    Complete by:  As directed      Call MD for:  severe uncontrolled pain    Complete by:  As directed      Call MD for:  temperature >100.4    Complete by:  As directed      Diet general    Complete by:  As directed      No dressing  needed    Complete by:  As directed      Sexual acrtivity    Complete by:  As directed   Pelvic rest for 6 weeks            Medication List    TAKE these medications        ferrous fumarate 325 (106 Fe) MG Tabs tablet  Commonly known as:  HEMOCYTE - 106 mg FE  Take 1 tablet (106 mg of iron total) by mouth daily.      norethindrone 0.35 MG tablet  Commonly known as:  MICRONOR,CAMILA,ERRIN  Take 1 tablet (0.35 mg total) by mouth daily.     prenatal multivitamin Tabs tablet  Take 1 tablet by mouth daily at 12 noon.       Follow-up Information    [facility],Jonas Goh, CNM. Schedule an appointment as soon as possible for a visit in 5 weeks.   Specialty:  Obstetrics   Why:  for postpartum follow up visit   Contact information:   [address] [place] [address] [phone]       Signed: Tresea MallGLEDHILL,Dymphna Wadley, CNM  This patient and plan were discussed with Dr Vergie LivingPickens 07/10/2015

## 2015-07-10 NOTE — Progress Notes (Signed)
Pt discharged home.  Discharge instructions, prescriptions and follow up appointment given to and reviewed with pt.  Pt verbalized understanding.  Escorted by auxillary. 

## 2015-07-11 LAB — URINE CULTURE

## 2015-08-10 LAB — HM PAP SMEAR

## 2016-03-25 HISTORY — PX: OTHER SURGICAL HISTORY: SHX169

## 2016-07-04 ENCOUNTER — Ambulatory Visit (INDEPENDENT_AMBULATORY_CARE_PROVIDER_SITE_OTHER): Payer: 59 | Admitting: Advanced Practice Midwife

## 2016-07-04 ENCOUNTER — Encounter: Payer: Self-pay | Admitting: Advanced Practice Midwife

## 2016-07-04 VITALS — Ht 65.0 in | Wt 170.0 lb

## 2016-07-04 DIAGNOSIS — R5383 Other fatigue: Secondary | ICD-10-CM | POA: Diagnosis not present

## 2016-07-04 DIAGNOSIS — Z124 Encounter for screening for malignant neoplasm of cervix: Secondary | ICD-10-CM

## 2016-07-04 DIAGNOSIS — Z308 Encounter for other contraceptive management: Secondary | ICD-10-CM

## 2016-07-04 DIAGNOSIS — Z01419 Encounter for gynecological examination (general) (routine) without abnormal findings: Secondary | ICD-10-CM | POA: Diagnosis not present

## 2016-07-04 DIAGNOSIS — Z113 Encounter for screening for infections with a predominantly sexual mode of transmission: Secondary | ICD-10-CM | POA: Diagnosis not present

## 2016-07-04 DIAGNOSIS — R635 Abnormal weight gain: Secondary | ICD-10-CM | POA: Diagnosis not present

## 2016-07-04 MED ORDER — PHENTERMINE HCL 37.5 MG PO TABS: 37.5000 mg | ORAL_TABLET | Freq: Every day | ORAL | 1 refills | Status: DC

## 2016-07-04 MED ORDER — NORETHINDRONE 0.35 MG PO TABS: 1.0000 | ORAL_TABLET | Freq: Every day | ORAL | 0 refills | Status: DC

## 2016-07-04 NOTE — Progress Notes (Signed)
Patient ID: Erica Serrano, female   DOB: 01/19/1991, 26 y.o.   MRN: [id number]     Gynecology Annual Exam  PCP: [facility]  Chief Complaint:  Chief Complaint  Patient presents with  . Annual Exam    History of Present Illness: Patient is a 26 y.o. G1P1001 presents for annual exam. The patient has complaint today of inability to lose weight despite ketogenic diet which she started 2 months ago and vigorous exercise 3 days per week. She is wondering if a trial of phentermine would be helpful. She has complaint of stopping Depo and switching to progesterone only pill 4 months ago and she has had heavy bleeding that stops and starts weekly since then. She would like to schedule an appointment to have the Nexplanon placed. She also has complaint of decreased libido. She states she always feels tired and overwhelmed with her full time job, her 26 year old and her focus on a new diet/exercise regimen.  LMP: Patient's last menstrual period was 05/25/2016. Average Interval: patient has not had a regular period since prior to her pregnancy in 2016/2017,  Duration of flow: off and on for last 4 months Heavy Menses: yes Clots: no Intermenstrual Bleeding: no Postcoital Bleeding: no Dysmenorrhea: no  The patient is sexually active. She currently uses currently progesterone only pill and wants Nexplanon asap for contraception. She denies dyspareunia.  The patient does perform self breast exams.  There is notable family history of breast or ovarian cancer in her family. Paternal aunt with breast cancer at age 74.  The patient wears seatbelts: yes.  The patient has regular exercise: yes.    The patient denies current symptoms of depression.    Review of Systems: Review of Systems  Constitutional: Negative.   HENT: Negative.   Eyes: Negative.   Respiratory: Negative.   Cardiovascular: Negative.   Gastrointestinal: Negative.   Genitourinary: Negative.   Musculoskeletal: Negative.     Skin: Negative.   Neurological: Negative.   Endo/Heme/Allergies: Negative.   Psychiatric/Behavioral: Negative.     Past Medical History:  Past Medical History:  Diagnosis Date  . Hemorrhoid   . Migraines   . Von Willebrand disease (HCC)     Past Surgical History:  Past Surgical History:  Procedure Laterality Date  . NO PAST SURGERIES      Gynecologic History:  Patient's last menstrual period was 05/25/2016. Last Pap: Results were: no abnormalities   Obstetric History: G1P1001  Family History:  Family History  Problem Relation Age of Onset  . Heart disease Father   . Von Willebrand disease Sister   . Cancer Sister     Social History:  Social History   Social History  . Marital status: Married    Spouse name: N/A  . Number of children: N/A  . Years of education: N/A   Occupational History  . Not on file.   Social History Main Topics  . Smoking status: Never Smoker  . Smokeless tobacco: Never Used  . Alcohol use No  . Drug use: No  . Sexual activity: Yes    Birth control/ protection: Pill   Other Topics Concern  . Not on file   Social History Narrative  . No narrative on file    Allergies:  No Known Allergies  Medications: Prior to Admission medications   Medication Sig Start Date End Date Taking? Authorizing Provider  norethindrone (MICRONOR,CAMILA,ERRIN) 0.35 MG tablet Take 1 tablet (0.35 mg total) by mouth daily. 07/04/16  Yes Tresea Mall, CNM  SUMAtriptan (IMITREX) 100 MG tablet Take 1/2 to 1 tablet at headache onset.  May take a second dose after 2 hours if needed. 11/29/15  Yes Historical Provider, MD  ferrous fumarate (HEMOCYTE - 106 MG FE) 325 (106 Fe) MG TABS tablet Take 1 tablet (106 mg of iron total) by mouth daily. Patient not taking: Reported on 07/04/2016 07/03/15   Farrel Conners, CNM  phentermine (ADIPEX-P) 37.5 MG tablet Take 1 tablet (37.5 mg total) by mouth daily before breakfast. 07/04/16   Tresea Mall, CNM  Prenatal Vit-Fe  Fumarate-FA (PRENATAL MULTIVITAMIN) TABS tablet Take 1 tablet by mouth daily at 12 noon.    Historical Provider, MD    Physical Exam Vitals: Height  (1.651 m), weight 170 lb (77.1 kg), last menstrual period 05/25/2016, unknown if currently breastfeeding.  General: NAD HEENT: normocephalic, anicteric Thyroid: no enlargement, no palpable nodules Pulmonary: No increased work of breathing, CTAB Cardiovascular: RRR, distal pulses 2+ Breast: Breast symmetrical, no tenderness, no palpable nodules or masses, no skin or nipple retraction present, no nipple discharge.  No axillary or supraclavicular lymphadenopathy. Abdomen: NABS, soft, non-tender, non-distended.  Umbilicus without lesions.  No hepatomegaly, splenomegaly or masses palpable. No evidence of hernia  Genitourinary:  External: Normal external female genitalia.  Normal urethral meatus, normal  Bartholin's and Skene's glands.    Vagina: Normal vaginal mucosa, no evidence of prolapse.    Cervix: Grossly normal in appearance, no bleeding  Uterus: Non-enlarged, mobile, normal contour.  No CMT  Adnexa: ovaries non-enlarged, no adnexal masses  Rectal: deferred  Lymphatic: no evidence of inguinal lymphadenopathy Extremities: no edema, erythema, or tenderness Neurologic: Grossly intact Psychiatric: mood appropriate, affect full   Assessment: 26 y.o. G1P1001 Well Woman exam with PAP smear, weight loss consult and medication Rx   Plan: Problem List Items Addressed This Visit    None    Visit Diagnoses    Weight gain    -  Primary   Relevant Medications   phentermine (ADIPEX-P) 37.5 MG tablet   Other Relevant Orders   TSH + free T4   Fatigue, unspecified type       Relevant Orders   TSH + free T4   Encounter for other contraceptive management       Relevant Medications   norethindrone (MICRONOR,CAMILA,ERRIN) 0.35 MG tablet   Cervical cancer screening       Relevant Orders   IGP,rfx Aptima HPV all pth   Screen for sexually  transmitted diseases       Relevant Orders   IGP,rfx Aptima HPV all pth   Well woman exam with routine gynecological exam       Relevant Orders   IGP,rfx Aptima HPV all pth      1) 4) Gardasil Series discussed and if applicable offered to patient - Patient has previously completed 3 shot series   2) STI screening as part of PAPtima  3) ASCCP guidelines and rational discussed.  Patient opts for yearly screening interval  4) Contraception - pt chooses Nexplanon over IUD at this time. Follow up appointment for insertion. 1 month supply of progesterone only pill until receives Nexplanon.  5) Continue healthy diet and exercise and keep big picture mental, physical, emotional health balance  6) 2 month course of phentermine given BMI of 28  7) Thyroid panel today  6) Follow up in 1 year for annual exam   Tresea Mall, CNM

## 2016-07-05 LAB — TSH+FREE T4
Free T4: 1.21 ng/dL (ref 0.82–1.77)
TSH: 1.11 u[IU]/mL (ref 0.450–4.500)

## 2016-07-09 LAB — IGP,RFX APTIMA HPV ALL PTH: PAP SMEAR COMMENT: 0

## 2016-07-16 ENCOUNTER — Ambulatory Visit (INDEPENDENT_AMBULATORY_CARE_PROVIDER_SITE_OTHER): Payer: 59 | Admitting: Advanced Practice Midwife

## 2016-07-16 ENCOUNTER — Encounter: Payer: Self-pay | Admitting: Advanced Practice Midwife

## 2016-07-16 VITALS — BP 114/72 | Ht 65.0 in | Wt 170.0 lb

## 2016-07-16 DIAGNOSIS — Z30017 Encounter for initial prescription of implantable subdermal contraceptive: Secondary | ICD-10-CM | POA: Diagnosis not present

## 2016-07-16 DIAGNOSIS — N921 Excessive and frequent menstruation with irregular cycle: Secondary | ICD-10-CM | POA: Diagnosis not present

## 2016-07-16 MED ORDER — METHYLERGONOVINE MALEATE 0.2 MG PO TABS: 0.2000 mg | ORAL_TABLET | Freq: Three times a day (TID) | ORAL | Status: DC

## 2016-07-16 MED ORDER — METHYLERGONOVINE MALEATE 0.2 MG PO TABS: 0.2000 mg | ORAL_TABLET | Freq: Three times a day (TID) | ORAL | 0 refills | Status: DC

## 2016-07-17 ENCOUNTER — Telehealth: Payer: Self-pay

## 2016-07-17 MED ORDER — ETONOGESTREL 68 MG ~~LOC~~ IMPL: 68.0000 mg | DRUG_IMPLANT | Freq: Once | SUBCUTANEOUS | Status: DC

## 2016-07-17 MED ORDER — MEGESTROL ACETATE 40 MG PO TABS: 40.0000 mg | ORAL_TABLET | Freq: Two times a day (BID) | ORAL | 0 refills | Status: AC

## 2016-07-17 NOTE — Telephone Encounter (Signed)
Pt states she was seen yesterday for nexplanon insertion and was given medication to help with bleeding issues but when she went to pick it up at the pharmacy it was $500. Pt wants to know if there is an alternative she can get. cb# [phone] thank you

## 2016-07-17 NOTE — Telephone Encounter (Signed)
Spoke with patient. Changed Rx to Megace.

## 2016-07-17 NOTE — Progress Notes (Signed)
       GYNECOLOGY PROCEDURE NOTE  Patient is a 26 y.o. G1P1001 presenting for Nexplanon insertion as her desires means of contraception.  She provided informed consent, signed copy in the chart, time out was performed. Pregnancy test was negatove, with self reported LMP of 05/25/2016  She understands that Nexplanon is a progesterone only therapy, and that patients often patients have irregular and unpredictable vaginal bleeding or amenorrhea. She understands that other side effects are possible related to systemic progesterone, including but not limited to, headaches, breast tenderness, nausea, and irritability. While effective at preventing pregnancy long acting reversible contraceptives do not prevent transmission of sexually transmitted diseases and use of barrier methods for this purpose was discussed. The placement procedure for Nexplanon was reviewed with the patient in detail including risks of nerve injury, infection, bleeding and injury to other muscles or tendons. She understands that the Nexplanon implant is good for 3 years and needs to be removed at the end of that time.  She understands that Nexplanon is an extremely effective option for contraception, with failure rate of <1%. This information is reviewed today and all questions were answered. Informed consent was obtained, both verbally and written.   The patient is healthy and has no contraindications to Implanon use. Urine pregnancy test was performed today and was negative.  Procedure Appropriate time out taken.  Patient placed in dorsal supine with left arm above head, elbow flexed at 90 degrees, arm resting on examination table.  The bicipital grove was palpated and site 8-10cm proximal to the medial epicondyle was indentified . The insertion site was prepped with  two betadine swabs and then injected with 2.5 ml of 1% lidocaine without epinephrine.  Nexplanon removed form sterile blister packaging,  Device confirmed in needle,  before inserting full length of needle, tenting up the skin as the needle was advanced.  The drug eluting rod was then deployed by pulling back the slider per the manufactures recommendation.  The implant was palpable by the clinician as well as the patient.  The insertion site covered dressed with a band aid before applying  a kerlex bandage pressure dressing..Minimal blood loss was noted during the procedure.  The patientt tolerated the procedure well.   She was instructed to wear the bandage for 24 hours, call with any signs of infection.  She was given the Implanon card and instructed to have the rod removed in 3 years.   Tresea Mall, CNM

## 2016-10-04 ENCOUNTER — Telehealth: Payer: Self-pay

## 2016-10-04 NOTE — Telephone Encounter (Signed)
Pt states she was seen a couple of months ago for abnormal bleeding. She currently has the nexplanon for contraception and c/o have a full blown menstrual cycle every 2 weeks or every other week. Pt unsure if it is related to her birth control or if the keto diet she is on has anything to do with it. Advised pt to schedule appt with MD to evaluate. Transferred to front desk for scheduling.

## 2016-10-18 ENCOUNTER — Ambulatory Visit (INDEPENDENT_AMBULATORY_CARE_PROVIDER_SITE_OTHER): Payer: 59 | Admitting: Obstetrics and Gynecology

## 2016-10-18 ENCOUNTER — Encounter: Payer: Self-pay | Admitting: Obstetrics and Gynecology

## 2016-10-18 DIAGNOSIS — N939 Abnormal uterine and vaginal bleeding, unspecified: Secondary | ICD-10-CM | POA: Diagnosis not present

## 2016-10-18 MED ORDER — MEDROXYPROGESTERONE ACETATE 150 MG/ML IM SUSP: 150.0000 mg | INTRAMUSCULAR | 3 refills | Status: DC

## 2016-10-18 NOTE — Progress Notes (Signed)
Gynecology Abnormal Uterine Bleeding Initial Evaluation   Chief Complaint:  Chief Complaint  Patient presents with  . abnormal bleeding    cramping before bleeding/heavy    History of Present Illness:    Paitient is a 26 y.o. G1P1001 who LMP was Patient's last menstrual period was 10/14/2016., presents today for a problem visit.  She complains of metrorrhagia (premenstrual) that  began about a month ago and its severity is described as moderate.  She has irregular periods from 14 to 30 days and they are associated with mild menstrual cramping.  She has used the following for attempts at control: tampon. The patient is sexually active. She currently uses nexplanon for contraception.   Previous evaluation: none. Prior Diagnosis: Von Willebrands Previous Treatment: depo provera . LMP: Patient's last menstrual period was 10/14/2016.  Did well previously on depo provera, current symptom onset correlates with timing of nexplanon placement   Review of Systems: Review of Systems  Constitutional: Negative for malaise/fatigue and weight loss.  Gastrointestinal: Negative for abdominal pain.  Neurological: Negative for dizziness.  Endo/Heme/Allergies: Bruises/bleeds easily.  Psychiatric/Behavioral: Negative for suicidal ideas.    Past Medical History:  Past Medical History:  Diagnosis Date  . Hemorrhoid   . Migraines   . Von Willebrand disease (HCC)     Past Surgical History:  Past Surgical History:  Procedure Laterality Date  . nexplanon insertion  2018  . NO PAST SURGERIES      Obstetric History: G1P1001  Family History:  Family History  Problem Relation Age of Onset  . Heart disease Father   . Von Willebrand disease Sister   . Cancer Sister     Social History:  Social History   Social History  . Marital status: Married    Spouse name: N/A  . Number of children: N/A  . Years of education: N/A   Occupational History  . Not on file.   Social History Main  Topics  . Smoking status: Never Smoker  . Smokeless tobacco: Never Used  . Alcohol use No  . Drug use: No  . Sexual activity: Yes    Birth control/ protection: Pill   Other Topics Concern  . Not on file   Social History Narrative  . No narrative on file    Allergies:  No Known Allergies  Medications: Prior to Admission medications   Medication Sig Start Date End Date Taking? Authorizing Provider  medroxyPROGESTERone (DEPO-PROVERA) 150 MG/ML injection Inject 1 mL (150 mg total) into the muscle every 3 (three) months. 10/18/16 01/16/17  Vena AustriaStaebler, Lari Linson, MD    Physical Exam Vitals:  Vitals:   10/18/16 0845  BP: 102/70  Pulse: 76   Patient's last menstrual period was 10/14/2016.  General: NAD HEENT: normocephalic, anicteric Pulmonary: No increased work of breathing Extremities: no edema, erythema, or tenderness, nexplanon palpated left upper extremity Neurologic: Grossly intact Psychiatric: mood appropriate, affect full  Female chaperone present for pelvic portions of the physical exam  Assessment: 26 y.o. G1P1001 AUB-I  Plan: Problem List Items Addressed This Visit      Genitourinary   Abnormal uterine bleeding (AUB)       1) Discussed management options for abnormal uterine bleeding including expectant, NSAIDs, tranexamic acid (Lysteda), oral progesterone (Provera, norethindrone, megace), Depo Provera, Levonorgestrel containing IUD, endometrial ablation (Novasure) or hysterectomy as definitive surgical management.  Discussed risks and benefits of each method.   Final management decision will hinge on results of patient's work up and whether an underlying  etiology for the patients bleeding symptoms can be discerned.  We will conduct a basic work up examining using the PALM-COIEN classification system. Printed patient education handouts were given to the patient to review at home.  Bleeding precautions reviewed.   Discussed that in this particular setting symptoms  likely iatrogenic.  She is not a candidate for estrogen secondary to migraines with Aura.  Will plan on nexplanon removal switch to depo next week.  A total of 15 minutes were spent in face-to-face contact with the patient during this encounter with over half of that time devoted to counseling and coordination of care.

## 2016-10-21 ENCOUNTER — Ambulatory Visit (INDEPENDENT_AMBULATORY_CARE_PROVIDER_SITE_OTHER): Payer: 59 | Admitting: Obstetrics and Gynecology

## 2016-10-21 ENCOUNTER — Encounter: Payer: Self-pay | Admitting: Obstetrics and Gynecology

## 2016-10-21 VITALS — BP 102/72 | HR 97 | Wt 153.0 lb

## 2016-10-21 DIAGNOSIS — Z3046 Encounter for surveillance of implantable subdermal contraceptive: Secondary | ICD-10-CM | POA: Diagnosis not present

## 2016-10-21 NOTE — Progress Notes (Signed)
     GYNECOLOGY PROCEDURE NOTE  Implanon removal discussed in detail.  Risks of infection, bleeding, nerve injury all reviewed.  Patient understands risks and desires to proceed.  Verbal consent obtained.  Patient is certain she wants the implanon removed.  All questions answered.  Procedure: Patient placed in dorsal supine with left arm above head, elbow flexed at 90 degrees, arm resting on examination table.  Implanon identified without problems.  Betadine scrub x3.  1 ml of 1% lidocaine injected under implanon device without problems.  Sterile gloves applied.  Small 0.5cm incision made at distal tip of implanon device with 11 blade scalpel.  Implanon brought to incision and grasped with a small kelly clamp.  Implanon removed intact without problems.  Pressure applied to incision.  Hemostasis obtained.  Steri-strips applied, followed by bandage and compression dressing.  Patient tolerated procedure well.  No complications.   Assessment: 26 y.o. year old female now s/p uncomplicated implanon removal.  Plan: 1.  Patient given post procedure precautions and asked to call for fever, chills, redness or drainage from her incision, bleeding from incision.  She understands she will likely have a small bruise near site of removal and can remove bandage tomorrow and steri-strips in approximately 1 week.  2) Contraception switch to depo provera

## 2017-07-01 ENCOUNTER — Other Ambulatory Visit: Payer: Self-pay | Admitting: Obstetrics and Gynecology

## 2017-07-01 MED ORDER — MEDROXYPROGESTERONE ACETATE 150 MG/ML IM SUSP: 150.0000 mg | INTRAMUSCULAR | 0 refills | Status: DC

## 2017-07-08 ENCOUNTER — Ambulatory Visit: Payer: 59 | Admitting: Obstetrics and Gynecology

## 2017-10-14 ENCOUNTER — Encounter: Payer: Self-pay | Admitting: Maternal Newborn

## 2017-10-14 ENCOUNTER — Ambulatory Visit (INDEPENDENT_AMBULATORY_CARE_PROVIDER_SITE_OTHER): Payer: 59 | Admitting: Maternal Newborn

## 2017-10-14 VITALS — BP 92/70 | HR 72 | Ht 65.0 in | Wt 155.0 lb

## 2017-10-14 DIAGNOSIS — R5383 Other fatigue: Secondary | ICD-10-CM | POA: Diagnosis not present

## 2017-10-14 DIAGNOSIS — R51 Headache: Secondary | ICD-10-CM

## 2017-10-14 DIAGNOSIS — Z01411 Encounter for gynecological examination (general) (routine) with abnormal findings: Secondary | ICD-10-CM | POA: Diagnosis not present

## 2017-10-14 DIAGNOSIS — Z01419 Encounter for gynecological examination (general) (routine) without abnormal findings: Secondary | ICD-10-CM

## 2017-10-14 DIAGNOSIS — Z3042 Encounter for surveillance of injectable contraceptive: Secondary | ICD-10-CM

## 2017-10-14 MED ORDER — MEDROXYPROGESTERONE ACETATE 150 MG/ML IM SUSP: 150.0000 mg | INTRAMUSCULAR | 3 refills | Status: DC

## 2017-10-14 NOTE — Progress Notes (Signed)
Gynecology Annual Exam  PCP: [facility], [facility]  Chief Complaint:  Chief Complaint  Patient presents with  . Gynecologic Exam    History of Present Illness: Patient is a 27 y.o. G1P1001 presents for annual exam. The patient has no complaints today.   LMP: No LMP recorded. Menses absent with injectable contraceptive.  The patient is sexually active. She currently uses Depo-Provera injections for contraception. She denies dyspareunia.  The patient does perform self breast exams.  There is notable family history of breast or ovarian cancer in her family, in her paternal sunt around age 27.   The patient has regular exercise: yes.    The patient reports current symptoms of depression. She is on hydroxyzine PRN to help with symptoms and does not desire daily medication. She has a provider who is managing her medication.  Review of Systems  Constitutional: Positive for malaise/fatigue.  HENT: Negative.   Eyes: Negative.   Respiratory: Negative for cough, shortness of breath and wheezing.   Cardiovascular: Negative for chest pain and palpitations.  Gastrointestinal: Positive for nausea. Negative for constipation, heartburn and vomiting.  Genitourinary: Negative.   Musculoskeletal: Negative.   Skin: Negative.   Neurological: Positive for headaches.  Endo/Heme/Allergies: Bruises/bleeds easily.  Psychiatric/Behavioral: Positive for depression. The patient is not nervous/anxious.   All other systems reviewed and are negative.   Past Medical History:  Past Medical History:  Diagnosis Date  . Depression   . Hemorrhoid   . Migraines   . PTSD (post-traumatic stress disorder)   . Von Willebrand disease (HCC) 01/13/2015    Past Surgical History:  Past Surgical History:  Procedure Laterality Date  . nexplanon insertion  2018  . NO PAST SURGERIES      Gynecologic History:  No LMP recorded. Patient has had an injection. Contraception: Depo-Provera injections Last Pap:  07/04/2016. Results were: NILM   Obstetric History: G1P1001  Family History:  Family History  Problem Relation Age of Onset  . Heart disease Father   . Von Willebrand disease Sister   . Cancer Sister        died age 585 - childhood cancer  . Breast cancer Paternal Aunt [id number]    Social History:  Social History   Socioeconomic History  . Marital status: Married    Spouse name: Not on file  . Number of children: Not on file  . Years of education: Not on file  . Highest education level: Not on file  Occupational History  . Not on file  Social Needs  . Financial resource strain: Not on file  . Food insecurity:    Worry: Not on file    Inability: Not on file  . Transportation needs:    Medical: Not on file    Non-medical: Not on file  Tobacco Use  . Smoking status: Never Smoker  . Smokeless tobacco: Never Used  Substance and Sexual Activity  . Alcohol use: No  . Drug use: No  . Sexual activity: Yes    Birth control/protection: Depo-Provera  Lifestyle  . Physical activity:    Days per week: Not on file    Minutes per session: Not on file  . Stress: Not on file  Relationships  . Social connections:    Talks on phone: Not on file    Gets together: Not on file    Attends religious service: Not on file    Active member of club or organization: Not on file    Attends meetings of clubs  or organizations: Not on file    Relationship status: Not on file  . Intimate partner violence:    Fear of current or ex partner: Not on file    Emotionally abused: Not on file    Physically abused: Not on file    Forced sexual activity: Not on file  Other Topics Concern  . Not on file  Social History Narrative  . Not on file    Allergies:  No Known Allergies  Medications: Prior to Admission medications   Medication Sig Start Date End Date Taking? Authorizing Provider  medroxyPROGESTERone (DEPO-PROVERA) 150 MG/ML injection Inject 1 mL (150 mg total) into the muscle every 3 (three)  months. 07/01/17 09/29/17  Vena Austria, MD    Physical Exam Vitals: Blood pressure 92/70, pulse 72, height 5\' 5"  (1.651 m), weight 155 lb (70.3 kg). Body mass index is 25.79 kg/m.  General: NAD HEENT: normocephalic, anicteric Thyroid: no enlargement, no palpable nodules Pulmonary: No increased work of breathing, CTAB Cardiovascular: RRR, no murmurs, rubs or gallops Breast: Breasts symmetrical, no tenderness, no palpable nodules or masses, no skin or nipple retraction present, no nipple discharge.  No axillary or supraclavicular lymphadenopathy. Abdomen: soft, non-tender, non-distended.  Umbilicus without lesions.  No hepatomegaly, splenomegaly or masses palpable. No evidence of hernia  Genitourinary: Exam declined after shared decision making. Low-risk, asymptomatic, 1 partner, m/m and normal Pap last year Extremities: no edema, erythema, or tenderness Neurologic: Grossly intact Psychiatric: mood appropriate, affect full  Assessment: 27 y.o. G1P1001 here for routine annual exam.  Plan: Problem List Items Addressed This Visit    None    Visit Diagnoses    Women's annual routine gynecological examination    -  Primary   Encounter for surveillance of injectable contraceptive         1) STI screening was offered and declined.  2) ASCCP guidelines and rationale discussed.  Patient opts for every 3 year screening interval.  3) Contraception - She is satisfied with Depo-Provera at this time.  4) Routine healthcare maintenance including cholesterol, diabetes screening discussed: Declines.  5) Family history of breast cancer - genetic screening discussed and written information given. She declines at this time.  6) Follow up 1 year for routine annual exam.  Marcelyn Bruins, CNM 10/14/2017  8:52 AM

## 2018-01-05 ENCOUNTER — Encounter: Payer: Self-pay | Admitting: Obstetrics and Gynecology

## 2018-01-05 ENCOUNTER — Ambulatory Visit (INDEPENDENT_AMBULATORY_CARE_PROVIDER_SITE_OTHER): Payer: 59 | Admitting: Obstetrics and Gynecology

## 2018-01-05 VITALS — BP 118/74 | Ht 65.0 in | Wt 158.0 lb

## 2018-01-05 DIAGNOSIS — N921 Excessive and frequent menstruation with irregular cycle: Secondary | ICD-10-CM | POA: Insufficient documentation

## 2018-01-05 DIAGNOSIS — N939 Abnormal uterine and vaginal bleeding, unspecified: Secondary | ICD-10-CM

## 2018-01-05 DIAGNOSIS — Z3042 Encounter for surveillance of injectable contraceptive: Secondary | ICD-10-CM

## 2018-01-05 MED ORDER — MEDROXYPROGESTERONE ACETATE 150 MG/ML IM SUSP
150.0000 mg | Freq: Once | INTRAMUSCULAR | Status: AC
  Administered 2018-01-05: 150 mg via INTRAMUSCULAR

## 2018-01-05 NOTE — Progress Notes (Signed)
Obstetrics & Gynecology Office Visit   Chief Complaint  Patient presents with  . Follow-up    BTB  Breakthrough bleeding on Depo Provera  History of Present Illness: 27 y.o. G70P1001 female who presents in follow up for abnormal bleeding with Depo Provera.  Her last shot was nearly three months ago.  About a month ago she had some very light spotting that lasted one day (did not even need a panty liner). She took a pregnancy test at that time, which was negative.  About two weeks later she had another episode of light spotting that lasted a day and a half.  The bleeding came back and turned into a "full-blown" period.  She had tightness in her chest and pain in her bilateral hips. She took another pregnancy test, which was also negative.  She is concerned due to the fact she has von Willebrands Disease. She was diagnosed with this at around the age of 12 years.  She used to carry a nasal medication around when she was playing sports (nasal DDAVP).  She used it again after she had her daughter.  She has had no major issues in her life as she has Type 1 vWD.  She was in the [employer] for several years and never had problems during that time.   She never had this problem in the past until she had her daughter (2.5 years ago).  She tried a pill postpartum. She has also tried Nexplanon, which made things worse.  So, since she had done well in the past with Depo, she was restarted on this one year ago.  She does not regularly see a hematologist.   Her last pap smear was one year ago and it was normal.  She has a distant history of abnormal pap smears, but since it became normal she has not had another abnormal one.  She has never had an STD. She has no reason to suspect she might be at risk for having one.    Past Medical History:  Diagnosis Date  . Depression   . Hemorrhoid   . Migraines   . PTSD (post-traumatic stress disorder)   . Von Willebrand disease (HCC) 01/13/2015    Past Surgical History:    Procedure Laterality Date  . nexplanon insertion  2018  . NO PAST SURGERIES     Gynecologic History: Patient's last menstrual period was 12/29/2017.  Obstetric History: G1P1001, s/p SVD x 1  Family History  Problem Relation Age of Onset  . Heart disease Father   . Von Willebrand disease Sister   . Cancer Sister        died age 64 - childhood cancer  . Breast cancer Paternal Aunt 9    Social History   Socioeconomic History  . Marital status: Married    Spouse name: Not on file  . Number of children: 1  . Years of education: 61  . Highest education level: Not on file  Occupational History  . Occupation: area Production designer, theatre/television/film    Comment: [employer]  Social Needs  . Financial resource strain: Not on file  . Food insecurity:    Worry: Not on file    Inability: Not on file  . Transportation needs:    Medical: Not on file    Non-medical: Not on file  Tobacco Use  . Smoking status: Never Smoker  . Smokeless tobacco: Never Used  Substance and Sexual Activity  . Alcohol use: No  . Drug use: No  . Sexual  activity: Yes    Birth control/protection: Injection  Lifestyle  . Physical activity:    Days per week: Not on file    Minutes per session: Not on file  . Stress: Not on file  Relationships  . Social connections:    Talks on phone: Not on file    Gets together: Not on file    Attends religious service: Not on file    Active member of club or organization: Not on file    Attends meetings of clubs or organizations: Not on file    Relationship status: Not on file  . Intimate partner violence:    Fear of current or ex partner: Not on file    Emotionally abused: Not on file    Physically abused: Not on file    Forced sexual activity: Not on file  Other Topics Concern  . Not on file  Social History Narrative  . Not on file   Allergies: No Known Allergies  Prior to Admission medications   Medication Sig Start Date End Date Taking? Authorizing Provider  medroxyPROGESTERone  (DEPO-PROVERA) 150 MG/ML injection Inject 1 mL (150 mg total) into the muscle every 3 (three) months. 10/14/17 01/12/18  Oswaldo Conroy, CNM    Review of Systems  Constitutional: Negative.   HENT: Negative.   Eyes: Negative.   Respiratory: Negative.   Cardiovascular: Negative.   Gastrointestinal: Negative.   Genitourinary: Negative.   Musculoskeletal: Negative.   Skin: Negative.   Neurological: Negative.   Psychiatric/Behavioral: Negative.     Physical Exam BP 118/74   Ht 5\' 5"  (1.651 m)   Wt 158 lb (71.7 kg)   LMP 12/29/2017   BMI 26.29 kg/m  Patient's last menstrual period was 12/29/2017. Physical Exam  Constitutional: She is oriented to person, place, and time. She appears well-developed and well-nourished. No distress.  Genitourinary: Vagina normal and uterus normal. Pelvic exam was performed with patient supine. There is no rash, tenderness, lesion or injury on the right labia. There is no rash, tenderness, lesion or injury on the left labia. Right adnexum does not display mass, does not display tenderness and does not display fullness. Left adnexum does not display mass, does not display tenderness and does not display fullness. Cervix does not exhibit motion tenderness, lesion or polyp.   Uterus is mobile. Uterus is not enlarged, tender, exhibiting a mass or irregular (is regular).  HENT:  Head: Normocephalic and atraumatic.  Eyes: EOM are normal. No scleral icterus.  Neck: Normal range of motion. Neck supple. No thyromegaly present.  Cardiovascular: Normal rate and regular rhythm.  Pulmonary/Chest: Effort normal and breath sounds normal. No respiratory distress. She has no wheezes. She has no rales.  Abdominal: Soft. Bowel sounds are normal. She exhibits no distension and no mass. There is no tenderness. There is no rebound and no guarding.  Musculoskeletal: Normal range of motion. She exhibits no edema.  Lymphadenopathy:    She has no cervical adenopathy.    Neurological: She is alert and oriented to person, place, and time. No cranial nerve deficit.  Skin: Skin is warm and dry. No erythema.  Psychiatric: She has a normal mood and affect. Her behavior is normal. Judgment normal.    Female chaperone present for pelvic and breast  portions of the physical exam  Assessment: 27 y.o. G55P1001 female here for  1. Menorrhagia with irregular cycle   2. Abnormal uterine bleeding (AUB)   3. Encounter for surveillance of injectable contraceptive  Plan: Problem List Items Addressed This Visit      Genitourinary   Abnormal uterine bleeding (AUB)   Relevant Orders   US PELVIS TRANSVANGINAL NON-OB (TV ONLY)     Other   Menorrhagia with irregular cycle - Primary   Relevant Orders   US PELVIS TRANSVANGINAL NON-OB (TV ONLY)    Other Visit Diagnoses    Encounter for surveillance of injectable contraceptive         Discussed that her new irregular bleeding could be a result of a variation of normal response to Depo-Provera.  She has had several negative pregnancy test at home and has been compliant with taking the medication in a timely fashion.  We did discuss that there is a risk of pregnancy with the medication and Depo-Provera has a 6% annual failure rate.  Discussed that other, structural issues could be involved.  None noted on exam today.  However, will obtain a pelvic ultrasound for further investigation.  Lastly, her von Willebrand factor could be low causing her to have more bleeding than she would normally have.  She has no other overt symptoms of low von Willebrand's factor.  She recently had an MRI of her brain and had bruising from the IV for the contrast.  However she states this is fairly typical for her receiving IV.  She denies epistaxis.  She notes easy bruising, which is normal for her.  We will have her follow-up after her ultrasound to investigate further and decide on a course of treatment or action.  20 minutes spent in face to  face discussion with > 50% spent in counseling,management, and coordination of care of her menorrhagia with irregular cycle and abnormal uterine bleeding.  Her next shot of Depo-Provera was given today as it was due.  Notably, she had a negative pregnancy test 1 week ago and one several weeks prior to that.  Thomasene Mohair, MD 01/05/2018 1:10 PM

## 2018-01-13 ENCOUNTER — Ambulatory Visit (INDEPENDENT_AMBULATORY_CARE_PROVIDER_SITE_OTHER): Payer: 59 | Admitting: Obstetrics and Gynecology

## 2018-01-13 ENCOUNTER — Ambulatory Visit (INDEPENDENT_AMBULATORY_CARE_PROVIDER_SITE_OTHER): Payer: 59

## 2018-01-13 ENCOUNTER — Encounter: Payer: Self-pay | Admitting: Obstetrics and Gynecology

## 2018-01-13 VITALS — BP 118/74 | Ht 65.0 in | Wt 156.0 lb

## 2018-01-13 DIAGNOSIS — N921 Excessive and frequent menstruation with irregular cycle: Secondary | ICD-10-CM

## 2018-01-13 DIAGNOSIS — R634 Abnormal weight loss: Secondary | ICD-10-CM | POA: Diagnosis not present

## 2018-01-13 DIAGNOSIS — N939 Abnormal uterine and vaginal bleeding, unspecified: Secondary | ICD-10-CM

## 2018-01-13 DIAGNOSIS — N938 Other specified abnormal uterine and vaginal bleeding: Secondary | ICD-10-CM | POA: Diagnosis not present

## 2018-01-13 MED ORDER — PHENTERMINE HCL 37.5 MG PO TABS
37.5000 mg | ORAL_TABLET | Freq: Every day | ORAL | 0 refills | Status: DC
Start: 2018-01-13 — End: 2019-05-12

## 2018-01-13 NOTE — Progress Notes (Signed)
Gynecology Ultrasound Follow Up   Chief Complaint  Patient presents with  . Follow-up  Abnormal uterine bleeding  History of Present Illness: Patient is a 27 y.o. female who presents today for ultrasound evaluation of the above .  Ultrasound demonstrates the following findings Adnexa: no masses seen  Uterus: anteverted with endometrial stripe  4.6 mm Additional: no abnormal findings  Past Medical History:  Diagnosis Date  . Depression   . Hemorrhoid   . Migraines   . PTSD (post-traumatic stress disorder)   . Von Willebrand disease (HCC) 01/13/2015    Past Surgical History:  Procedure Laterality Date  . nexplanon insertion  2018  . NO PAST SURGERIES     Family History  Problem Relation Age of Onset  . Heart disease Father   . Von Willebrand disease Sister   . Cancer Sister        died age 27 - childhood cancer  . Breast cancer Paternal Aunt 42    Social History   Socioeconomic History  . Marital status: Married    Spouse name: Not on file  . Number of children: 1  . Years of education: 68  . Highest education level: Not on file  Occupational History  . Occupation: area Production designer, theatre/television/film    Comment: [employer]  Social Needs  . Financial resource strain: Not on file  . Food insecurity:    Worry: Not on file    Inability: Not on file  . Transportation needs:    Medical: Not on file    Non-medical: Not on file  Tobacco Use  . Smoking status: Never Smoker  . Smokeless tobacco: Never Used  Substance and Sexual Activity  . Alcohol use: No  . Drug use: No  . Sexual activity: Yes    Birth control/protection: Injection  Lifestyle  . Physical activity:    Days per week: Not on file    Minutes per session: Not on file  . Stress: Not on file  Relationships  . Social connections:    Talks on phone: Not on file    Gets together: Not on file    Attends religious service: Not on file    Active member of club or organization: Not on file    Attends meetings of clubs or  organizations: Not on file    Relationship status: Not on file  . Intimate partner violence:    Fear of current or ex partner: Not on file    Emotionally abused: Not on file    Physically abused: Not on file    Forced sexual activity: Not on file  Other Topics Concern  . Not on file  Social History Narrative  . Not on file    No Known Allergies  Prior to Admission medications   Medication Sig Start Date End Date Taking? Authorizing Provider  hydrOXYzine (VISTARIL) 25 MG capsule Take 25 mg by mouth as needed.    [provider]  medroxyPROGESTERone (DEPO-PROVERA) 150 MG/ML injection Inject 1 mL (150 mg total) into the muscle every 3 (three) months. 10/14/17 01/12/18  Oswaldo Conroy, CNM   Physical Exam BP 118/74   Ht 5\' 5"  (1.651 m)   Wt 156 lb (70.8 kg)   LMP 12/29/2017   BMI 25.96 kg/m    General: NAD HEENT: normocephalic, anicteric Pulmonary: No increased work of breathing Extremities: no edema, erythema, or tenderness Neurologic: Grossly intact, normal gait Psychiatric: mood appropriate, affect full  Imagine Results: US Pelvis Transvanginal Non-ob (tv Only)  Result Date: 01/13/2018 Patient Name: Erica Serrano DOB: 12/07/1990 MRN: [id number] ULTRASOUND REPORT Location: [facility] Date of Service: 01/13/2018 Indications: Abnormal uterine bleeding Findings: The uterus is anteverted and measures 6.9 x 4.3 x 3.1 cm. Echo texture is homogenous without evidence of focal masses. The Endometrium measures 4.6 mm. Right Ovary measures 2.9 x 1.9 x 2.1 cm. It is normal in appearance. Left Ovary measures 3.0 x 2.3 x 2.0 cm. It is normal in appearance. Survey of the adnexa demonstrates no adnexal masses. There is no free fluid in the cul de sac. Impression: 1. Normal gyn ultrasound. Darlina Guys, RDMS RVT The ultrasound images and findings were reviewed by me and I agree with the above report. Thomasene Mohair, MD, [facility], [facility] 01/13/2018  4:18 PM      Assessment: 27 y.o. G1P1001  1. Menorrhagia with irregular cycle   2. Abnormal uterine bleeding (AUB)   3. Weight loss      Plan: Problem List Items Addressed This Visit      Genitourinary   Abnormal uterine bleeding (AUB)     Other   Menorrhagia with irregular cycle - Primary    Other Visit Diagnoses    Weight loss       Relevant Medications   phentermine (ADIPEX-P) 37.5 MG tablet     Discussed findings on ultrasound.  Looking back over her old records she does have a history of abnormal periods for which she has tried multiple forms of contraception.  Depo-Provera seems to have been working the best for her.  She would like to give it more time to see if her symptoms continue.  If they do continue, I would recommend having her see a hematologist to assess her from a von Willebrand's disease standpoint.  Though, her disease does not seem to have been very active in her life.  Additionally, she would like to lose some weight that she has been trying to get rid of.  We discussed that she has a non-obese BMI.  However, she states that she has gotten to a point that she can get no lower with her weight and would like to try phentermine again.  I did look up some studies on pub med to see if there is a contraindication with phentermine and migraines.  There is one study where topiramate and phentermine are using combination for weight loss without any headache side effects.  We will try for short trial and if she has no side effects may continue for up to 3 months.  She was given precautions for headache and the usual possible side effects for phentermine.  20 minutes spent in face to face discussion with > 50% spent in counseling,management, and coordination of care of her abnormal uterine bleeding, menorrhagia with irregular cycle, and weight loss.  Thomasene Mohair, MD, [facility], [facility] 01/13/2018 5:36 PM

## 2019-01-15 ENCOUNTER — Ambulatory Visit: Payer: Self-pay | Admitting: Psychiatry

## 2019-01-18 ENCOUNTER — Ambulatory Visit (INDEPENDENT_AMBULATORY_CARE_PROVIDER_SITE_OTHER): Payer: No Typology Code available for payment source | Admitting: Licensed Clinical Social Worker

## 2019-01-18 ENCOUNTER — Other Ambulatory Visit: Payer: Self-pay

## 2019-01-18 ENCOUNTER — Encounter (HOSPITAL_COMMUNITY): Payer: Self-pay | Admitting: Licensed Clinical Social Worker

## 2019-01-18 DIAGNOSIS — F321 Major depressive disorder, single episode, moderate: Secondary | ICD-10-CM | POA: Diagnosis not present

## 2019-01-18 DIAGNOSIS — F4310 Post-traumatic stress disorder, unspecified: Secondary | ICD-10-CM

## 2019-01-18 DIAGNOSIS — F411 Generalized anxiety disorder: Secondary | ICD-10-CM | POA: Diagnosis not present

## 2019-01-18 NOTE — Progress Notes (Signed)
Comprehensive Clinical Assessment (CCA) Note  01/18/2019 Erica Serrano [id number]  Visit Diagnosis:      ICD-10-CM   1. PTSD (post-traumatic stress disorder)  F43.10   2. Current moderate episode of major depressive disorder, unspecified whether recurrent (HCC)  F32.1   3. GAD (generalized anxiety disorder)  F41.1       CCA Part One  Part One has been completed on paper by the patient.  (See scanned document in Chart Review)  CCA Part Two A  Intake/Chief Complaint:  CCA Intake With Chief Complaint CCA Part Two Date: 01/18/19 CCA Part Two Time: 1524 Chief Complaint/Presenting Problem: I want to deal with my biggest problems: anxiety - everything makes me anxious, anxiety attacks not sure if they're panic attacks. I have anger issues. I don't feel connected to people including my husband and daughter. Patients Currently Reported Symptoms/Problems: I don't like crowds, standing in line, panic or anxiety attacks, no connection, anger issues Collateral Involvement: [facility] note Individual's Strengths: no support system, my brother and his wife try to help. My mom and stepfather don't understand Individual's Preferences: prefers to not feel like any of this Individual's Abilities: ability to work a program of recovery Type of Services Patient Feels Are Needed: therapy  Mental Health Symptoms Depression:  Depression: Change in energy/activity, Difficulty Concentrating, Fatigue, Worthlessness, Tearfulness, Irritability, Sleep (too much or little)(no connection, lack of motivation)  Mania:     Anxiety:   Anxiety: Difficulty concentrating, Fatigue, Irritability, Restlessness, Sleep, Tension, Worrying  Psychosis:     Trauma:  Trauma: Avoids reminders of event, Detachment from others, Emotional numbing, Guilt/shame, Hypervigilance, Irritability/anger(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, 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)  Obsessions:  Obsessions: Intrusive/time consuming   Compulsions:  Compulsions: N/A  Inattention:  Inattention: N/A  Hyperactivity/Impulsivity:  Hyperactivity/Impulsivity: N/A  Oppositional/Defiant Behaviors:     Borderline Personality:     Other Mood/Personality Symptoms:      Mental Status Exam Appearance and self-care  Stature:  Stature: Average  Weight:  Weight: Average weight  Clothing:  Clothing: Casual  Grooming:  Grooming: Normal  Cosmetic use:  Cosmetic Use: None  Posture/gait:  Posture/Gait: Normal  Motor activity:  Motor Activity: Restless  Sensorium  Attention:  Attention: Normal  Concentration:  Concentration: Anxiety interferes  Orientation:  Orientation: X5  Recall/memory:  Recall/Memory: Defective in short-term(defective in long term)  Affect and Mood  Affect:  Affect: Anxious  Mood:  Mood: Anxious  Relating  Eye contact:  Eye Contact: None  Facial expression:  Facial Expression: Anxious  Attitude toward examiner:  Attitude Toward Examiner: Cooperative  Thought and Language  Speech flow: Speech Flow: Normal  Thought content:  Thought Content: Appropriate to mood and circumstances  Preoccupation:  Preoccupations: Obsessions  Hallucinations:     Organization:     Company secretaryxecutive Functions  Fund of Knowledge:  Fund of Knowledge: Impoverished by:  (Comment)  Intelligence:  Intelligence: Average  Abstraction:  Abstraction: Normal  Judgement:  Judgement: Fair  Dance movement psychotherapisteality Testing:  Reality Testing: Realistic  Insight:  Insight: Good  Decision Making:  Decision Making: Normal  Social Functioning  Social Maturity:  Social Maturity: Responsible  Social Judgement:  Social Judgement: Normal  Stress  Stressors:  Stressors: Family conflict, Grief/losses, Arts administratorMoney, Transitions, Work(COVID pandemic)  Coping Ability:  Coping Ability: Deficient supports  Skill Deficits:     Supports:      Family and Psychosocial History: Family history Marital status: Married Number of Years Married: 4 What types of issues  is patient dealing  with in the relationship?: husband dx with PTSD and depression Nature conservation officer) Does patient have children?: Yes How many children?: 1 How is patient's relationship with their children?: good but i don't feel a connection sometimes with her  Childhood History:  Childhood History By whom was/is the patient raised?: Mother/father and step-parent, Mother, Father Additional childhood history information: parents divorced when i was 4, i don't remember much they didn't fight much. stepfather came at age 40, more of a father figure Description of patient's relationship with caregiver when they were a child: my dad worked all the time, my mom was my best friend Patient's description of current relationship with people who raised him/her: good relationship with father, mother, stepfather How were you disciplined when you got in trouble as a child/adolescent?: i wasn't disciplined, i was a good kid Does patient have siblings?: Yes Number of Siblings: 2 Description of patient's current relationship with siblings: my sister passed away when she was 4 and i was 2 (cancer), good relationship with brother Did patient suffer any verbal/emotional/physical/sexual abuse as a child?: No Did patient suffer from severe childhood neglect?: No Has patient ever been sexually abused/assaulted/raped as an adolescent or adult?: No Was the patient ever a victim of a crime or a disaster?: No Witnessed domestic violence?: No Has patient been effected by domestic violence as an adult?: No  CCA Part Two B  Employment/Work Situation: Employment / Work Psychologist, occupational Employment situation: Employed Where is patient currently employed?: [employer] How long has patient been employed?: 1 year almost 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: evaluated by psychologist Are There Guns or Other Weapons in Your Home?: Yes Are These Weapons Safely Secured?: Yes  Education: Education Did Garment/textile technologist From [employer]?: Yes Did Theme park manager?: Yes What Type of College Degree Do you Have?: BS Exercise and sport science and minor in religious studies Did Designer, television/film set?: No Did You Have An Individualized Education Program (IIEP): No Did You Have Any Difficulty At [employer]?: No  Religion: Religion/Spirituality Are You A Religious Person?: Yes What is Your Religious Affiliation?: Catholic  Leisure/Recreation: Leisure / Recreation Leisure and Hobbies: exercise  Exercise/Diet: Exercise/Diet Do You Exercise?: Yes What Type of Exercise Do You Do?: Other (Comment) How Many Times a Week Do You Exercise?: 1-3 times a week Have You Gained or Lost A Significant Amount of Weight in the Past Six Months?: No Do You Follow a Special Diet?: No Do You Have Any Trouble Sleeping?: Yes Explanation of Sleeping Difficulties: I don't reach REM sleep  CCA Part Two C  Alcohol/Drug Use: Alcohol / Drug Use History of alcohol / drug use?: No history of alcohol / drug abuse                      CCA Part Three  ASAM's:  Six Dimensions of Multidimensional Assessment  Dimension 1:  Acute Intoxication and/or Withdrawal Potential:     Dimension 2:  Biomedical Conditions and Complications:     Dimension 3:  Emotional, Behavioral, or Cognitive Conditions and Complications:     Dimension 4:  Readiness to Change:     Dimension 5:  Relapse, Continued use, or Continued Problem Potential:     Dimension 6:  Recovery/Living Environment:  Substance use Disorder (SUD)    Social Function:  Social Functioning Social Maturity: Responsible Social Judgement: Normal  Stress:  Stress Stressors: Family conflict, Grief/losses, Money, Transitions, Work(COVID pandemic) Coping Ability: Deficient supports Patient Takes  Medications The Way The Doctor Instructed?: No Priority Risk: Low Acuity  Risk Assessment- Self-Harm Potential: Risk Assessment For Self-Harm Potential Thoughts of Self-Harm: No current thoughts Method: No plan Availability of Means: No access/NA  Risk Assessment -Dangerous to Others Potential: Risk Assessment For Dangerous to Others Potential Method: No Plan Availability of Means: No access or NA Intent: Vague intent or NA Notification Required: No need or identified person  DSM5 Diagnoses: Patient Active Problem List   Diagnosis Date Noted  . Menorrhagia with irregular cycle 01/05/2018  . Abnormal uterine bleeding (AUB) 10/18/2016  . Endometritis following delivery 07/07/2015  . Von Willebrands disease ([place]) 12/01/2014  . Migraine 12/01/2014    Patient Centered Plan: Patient is on the following Treatment Plan(s):  Ptsd, depression, anxiety  Recommendations for Services/Supports/Treatments: Recommendations for Services/Supports/Treatments Recommendations For Services/Supports/Treatments: Individual Therapy  Treatment Plan Summary:    Referrals to Alternative Service(s): Referred to Alternative Service(s):   Place:   Date:   Time:    Referred to Alternative Service(s):   Place:   Date:   Time:    Referred to Alternative Service(s):   Place:   Date:   Time:    Referred to Alternative Service(s):   Place:   Date:   Time:     Jenkins Rouge

## 2019-01-25 ENCOUNTER — Encounter (HOSPITAL_COMMUNITY): Payer: Self-pay | Admitting: Licensed Clinical Social Worker

## 2019-01-25 ENCOUNTER — Ambulatory Visit (INDEPENDENT_AMBULATORY_CARE_PROVIDER_SITE_OTHER): Payer: No Typology Code available for payment source | Admitting: Licensed Clinical Social Worker

## 2019-01-25 ENCOUNTER — Other Ambulatory Visit: Payer: Self-pay

## 2019-01-25 DIAGNOSIS — F411 Generalized anxiety disorder: Secondary | ICD-10-CM | POA: Diagnosis not present

## 2019-01-25 DIAGNOSIS — F4310 Post-traumatic stress disorder, unspecified: Secondary | ICD-10-CM

## 2019-01-25 DIAGNOSIS — F321 Major depressive disorder, single episode, moderate: Secondary | ICD-10-CM

## 2019-01-25 NOTE — Progress Notes (Signed)
Virtual Visit via Video Note  I connected with Erica Serrano on 01/25/19 at  3:00 PM EST by a video enabled telemedicine application and verified that I am speaking with the correct person using two identifiers.   I discussed the limitations of evaluation and management by telemedicine and the availability of in person appointments. The patient expressed understanding and agreed to proceed.  History of Present Illness: Patient is referred for therapy by [facility] for PTSD, depression and anxiety    Observations/Objective: Patient presents anxious for her webex telehealth session. Assisted patient identifying goals for therapy and completing the treatment plan.   Assessment and Plan: Spent most of the session building a trusting, therapeutic relationship and gaining background information on on-going anxiety symptoms and coping tools.   Follow Up Instructions:    I discussed the assessment and treatment plan with the patient. The patient was provided an opportunity to ask questions and all were answered. The patient agreed with the plan and demonstrated an understanding of the instructions.   The patient was advised to call back or seek an in-person evaluation if the symptoms worsen or if the condition fails to improve as anticipated.  I provided 60 minutes of non-face-to-face time during this encounter.   Shaylie Eklund S, LCAS

## 2019-02-01 ENCOUNTER — Encounter (HOSPITAL_COMMUNITY): Payer: Self-pay | Admitting: Licensed Clinical Social Worker

## 2019-02-01 ENCOUNTER — Other Ambulatory Visit: Payer: Self-pay

## 2019-02-01 ENCOUNTER — Ambulatory Visit (INDEPENDENT_AMBULATORY_CARE_PROVIDER_SITE_OTHER): Payer: No Typology Code available for payment source | Admitting: Licensed Clinical Social Worker

## 2019-02-01 DIAGNOSIS — F411 Generalized anxiety disorder: Secondary | ICD-10-CM

## 2019-02-01 DIAGNOSIS — F4310 Post-traumatic stress disorder, unspecified: Secondary | ICD-10-CM | POA: Diagnosis not present

## 2019-02-01 DIAGNOSIS — F321 Major depressive disorder, single episode, moderate: Secondary | ICD-10-CM | POA: Diagnosis not present

## 2019-02-01 NOTE — Progress Notes (Signed)
Virtual Visit via Video Note  I connected with Anne Fu on 02/01/19 at  3:00 PM EST by a video enabled telemedicine application and verified that I am speaking with the correct person using two identifiers.   I discussed the limitations of evaluation and management by telemedicine and the availability of in person appointments. The patient expressed understanding and agreed to proceed.  History of Present Illness: Patient is referred for therapy by [facility] for PTSD, depression and anxiety    Observations/Objective: Patient presents anxious for her webex telehealth session. Patient discussed her psychiatric symptoms and current life events. Patient discussed her stressors and current coping skills. Patient's biggest stressor now is family relationships ( daughter only going to her, husband wants time with his wife...). Used socratic questions. Patient discussed her thanksgiving plans to go visit her father and his family for around 2 weeks. She will be there for her last 2 appointments in the month.    Assessment and Plan: Counselor will continue to meet with patient to address treatment plan goals. Patient will continue to follow recommendations of providers and implement skills learned in session.   Follow Up Instructions:    I discussed the assessment and treatment plan with the patient. The patient was provided an opportunity to ask questions and all were answered. The patient agreed with the plan and demonstrated an understanding of the instructions.   The patient was advised to call back or seek an in-person evaluation if the symptoms worsen or if the condition fails to improve as anticipated.  I provided 45 minutes of non-face-to-face time during this encounter.   Timtohy Broski S, LCAS

## 2019-02-08 ENCOUNTER — Encounter (HOSPITAL_COMMUNITY): Payer: Self-pay | Admitting: Licensed Clinical Social Worker

## 2019-02-08 ENCOUNTER — Ambulatory Visit (INDEPENDENT_AMBULATORY_CARE_PROVIDER_SITE_OTHER): Payer: No Typology Code available for payment source | Admitting: Licensed Clinical Social Worker

## 2019-02-08 ENCOUNTER — Other Ambulatory Visit: Payer: Self-pay

## 2019-02-08 DIAGNOSIS — F4310 Post-traumatic stress disorder, unspecified: Secondary | ICD-10-CM

## 2019-02-08 DIAGNOSIS — F321 Major depressive disorder, single episode, moderate: Secondary | ICD-10-CM

## 2019-02-08 DIAGNOSIS — F411 Generalized anxiety disorder: Secondary | ICD-10-CM

## 2019-02-08 NOTE — Progress Notes (Signed)
Virtual Visit via Video Note  I connected with Anne Fu on 02/08/19 at  3:00 PM EST by a video enabled telemedicine application and verified that I am speaking with the correct person using two identifiers.   I discussed the limitations of evaluation and management by telemedicine and the availability of in person appointments. The patient expressed understanding and agreed to proceed.  History of Present Illness: Patient is referred for therapy by [facility] for PTSD, depression and anxiety    Observations/Objective: Patient presents anxious for her webex telehealth session. Patient discussed her psychiatric symptoms and current life events. Patient discussed her stressors and current coping skills. Patient discussed traumatic events she has experienced. She named them but the biggest experience was in [place]. Used CPT to assist patient with trauma in war.       Assessment and Plan: Counselor will continue to meet with patient to address treatment plan goals. Patient will continue to follow recommendations of providers and implement skills learned in session.   Follow Up Instructions:    I discussed the assessment and treatment plan with the patient. The patient was provided an opportunity to ask questions and all were answered. The patient agreed with the plan and demonstrated an understanding of the instructions.   The patient was advised to call back or seek an in-person evaluation if the symptoms worsen or if the condition fails to improve as anticipated.  I provided 45 minutes of non-face-to-face time during this encounter.   Charleston Vierling S, LCAS

## 2019-02-15 ENCOUNTER — Other Ambulatory Visit: Payer: Self-pay

## 2019-02-15 ENCOUNTER — Ambulatory Visit (INDEPENDENT_AMBULATORY_CARE_PROVIDER_SITE_OTHER): Payer: No Typology Code available for payment source | Admitting: Licensed Clinical Social Worker

## 2019-02-15 ENCOUNTER — Encounter (HOSPITAL_COMMUNITY): Payer: Self-pay | Admitting: Licensed Clinical Social Worker

## 2019-02-15 DIAGNOSIS — F411 Generalized anxiety disorder: Secondary | ICD-10-CM

## 2019-02-15 DIAGNOSIS — F4310 Post-traumatic stress disorder, unspecified: Secondary | ICD-10-CM

## 2019-02-15 DIAGNOSIS — F321 Major depressive disorder, single episode, moderate: Secondary | ICD-10-CM | POA: Diagnosis not present

## 2019-02-15 NOTE — Progress Notes (Signed)
Virtual Visit via Video Note  I connected with Erica Serrano on 02/15/19 at  3:00 PM EST by a video enabled telemedicine application and verified that I am speaking with the correct person using two identifiers.   I discussed the limitations of evaluation and management by telemedicine and the availability of in person appointments. The patient expressed understanding and agreed to proceed.  History of Present Illness: Patient is referred for therapy by [facility] for PTSD, depression and anxiety    Observations/Objective: Patient presents anxious for her webex telehealth session. Patient discussed her psychiatric symptoms and current life events. Patient discussed her stressors and current coping skills. Patient discussed her travel to [place] to see her family for thanksgiving. Suggested to patient to continue to use her self care during her travel and monitor her moods. Suggested mindfulness techniques as a coping skill.  Assessment and Plan: Counselor will continue to meet with patient to address treatment plan goals. Patient will continue to follow recommendations of providers and implement skills learned in session.   Follow Up Instructions: I discussed the assessment and treatment plan with the patient. The patient was provided an opportunity to ask questions and all were answered. The patient agreed with the plan and demonstrated an understanding of the instructions.   The patient was advised to call back or seek an in-person evaluation if the symptoms worsen or if the condition fails to improve as anticipated.  I provided 25 minutes of non-face-to-face time during this encounter.   Asharia Lotter S, LCAS

## 2019-02-22 ENCOUNTER — Encounter (HOSPITAL_COMMUNITY): Payer: Self-pay | Admitting: Licensed Clinical Social Worker

## 2019-02-22 ENCOUNTER — Ambulatory Visit (INDEPENDENT_AMBULATORY_CARE_PROVIDER_SITE_OTHER): Payer: No Typology Code available for payment source | Admitting: Licensed Clinical Social Worker

## 2019-02-22 ENCOUNTER — Other Ambulatory Visit: Payer: Self-pay

## 2019-02-22 DIAGNOSIS — F4310 Post-traumatic stress disorder, unspecified: Secondary | ICD-10-CM

## 2019-02-22 DIAGNOSIS — F411 Generalized anxiety disorder: Secondary | ICD-10-CM | POA: Diagnosis not present

## 2019-02-22 DIAGNOSIS — F321 Major depressive disorder, single episode, moderate: Secondary | ICD-10-CM | POA: Diagnosis not present

## 2019-02-22 NOTE — Progress Notes (Addendum)
Virtual Visit via Video Note  I connected with Anne Fu on 02/22/19 at  3:00 PM EST by a video enabled telemedicine application and verified that I am speaking with the correct person using two identifiers.   I discussed the limitations of evaluation and management by telemedicine and the availability of in person appointments. The patient expressed understanding and agreed to proceed.  History of Present Illness: Patient is referred for therapy by [facility] for PTSD, depression and anxiety    Observations/Objective: Patient presents anxious for her webex telehealth session. Patient discussed her psychiatric symptoms and current life events. Patient discussed her stressors and current coping skills. Patient is still in [place] and will be returning to [facility] tomorrow. Patient reports her anxiety has increased exponentially as the #'s of covid continue to rise. Discussed alternatives and practice of safety measures. Encourage patient to participate in self care 1x per week and discussed the importance of self care. Patient acknowledgement from verbal acceptance from 11/2.     Assessment and Plan: Counselor will continue to meet with patient to address treatment plan goals. Patient will continue to follow recommendations of providers and implement skills learned in session.   Follow Up Instructions: I discussed the assessment and treatment plan with the patient. The patient was provided an opportunity to ask questions and all were answered. The patient agreed with the plan and demonstrated an understanding of the instructions.   The patient was advised to call back or seek an in-person evaluation if the symptoms worsen or if the condition fails to improve as anticipated.  I provided 60 minutes of non-face-to-face time during this encounter.   Vy Badley S, LCAS

## 2019-03-03 ENCOUNTER — Other Ambulatory Visit: Payer: Self-pay

## 2019-03-03 ENCOUNTER — Ambulatory Visit (HOSPITAL_COMMUNITY): Payer: No Typology Code available for payment source | Admitting: Licensed Clinical Social Worker

## 2019-03-03 ENCOUNTER — Telehealth (HOSPITAL_COMMUNITY): Payer: Self-pay | Admitting: Licensed Clinical Social Worker

## 2019-03-03 NOTE — Telephone Encounter (Signed)
Patient didn't present for Webex therapy session. Counselor called patient and left vm to join the session and sent an email. Patient did not join the session. Erica Serrano, LCAS

## 2019-03-11 ENCOUNTER — Ambulatory Visit (HOSPITAL_COMMUNITY): Payer: No Typology Code available for payment source | Admitting: Licensed Clinical Social Worker

## 2019-03-17 ENCOUNTER — Ambulatory Visit (HOSPITAL_COMMUNITY): Payer: No Typology Code available for payment source | Admitting: Licensed Clinical Social Worker

## 2019-03-22 ENCOUNTER — Encounter (HOSPITAL_COMMUNITY): Payer: Self-pay | Admitting: Licensed Clinical Social Worker

## 2019-03-22 ENCOUNTER — Ambulatory Visit (INDEPENDENT_AMBULATORY_CARE_PROVIDER_SITE_OTHER): Payer: No Typology Code available for payment source | Admitting: Licensed Clinical Social Worker

## 2019-03-22 ENCOUNTER — Other Ambulatory Visit: Payer: Self-pay

## 2019-03-22 DIAGNOSIS — F321 Major depressive disorder, single episode, moderate: Secondary | ICD-10-CM

## 2019-03-22 DIAGNOSIS — F411 Generalized anxiety disorder: Secondary | ICD-10-CM | POA: Diagnosis not present

## 2019-03-22 DIAGNOSIS — F4310 Post-traumatic stress disorder, unspecified: Secondary | ICD-10-CM

## 2019-03-22 NOTE — Progress Notes (Signed)
Virtual Visit via Video Note  I connected with Erica Serrano on 03/22/19 at 11:00 AM EST by a video enabled telemedicine application and verified that I am speaking with the correct person using two identifiers.   I discussed the limitations of evaluation and management by telemedicine and the availability of in person appointments. The patient expressed understanding and agreed to proceed.  History of Present Illness: Patient is referred for therapy by [facility] for PTSD, depression and anxiety    Observations/Objective: Patient presents anxious for her webex telehealth session. Patient discussed her psychiatric symptoms and current life events. Patient identified her stressors and current coping skills. Patient had canceled her appointments for December due to work restraints. Worked out scheduling her appointments for days not working.  Patient reports conflictual family relationship issues. Patient was tearful in describing her problematic relationships. Coached patient on boundaries, relationship circles. Role played boundaries and showed patient the different levels of relationships. Patient was receptive to coaching.   Assessment and Plan: Counselor will continue to meet with patient to address treatment plan goals. Patient will continue to follow recommendations of providers and implement skills learned in session.   Follow Up Instructions: I discussed the assessment and treatment plan with the patient. The patient was provided an opportunity to ask questions and all were answered. The patient agreed with the plan and demonstrated an understanding of the instructions.   The patient was advised to call back or seek an in-person evaluation if the symptoms worsen or if the condition fails to improve as anticipated.  I provided 60 minutes of non-face-to-face time during this encounter.   Tatijana Bierly S, LCAS

## 2019-03-24 ENCOUNTER — Ambulatory Visit (HOSPITAL_COMMUNITY): Payer: No Typology Code available for payment source | Admitting: Licensed Clinical Social Worker

## 2019-03-29 ENCOUNTER — Other Ambulatory Visit: Payer: Self-pay

## 2019-03-29 ENCOUNTER — Ambulatory Visit (INDEPENDENT_AMBULATORY_CARE_PROVIDER_SITE_OTHER): Payer: No Typology Code available for payment source | Admitting: Licensed Clinical Social Worker

## 2019-03-29 ENCOUNTER — Encounter (HOSPITAL_COMMUNITY): Payer: Self-pay | Admitting: Licensed Clinical Social Worker

## 2019-03-29 DIAGNOSIS — F321 Major depressive disorder, single episode, moderate: Secondary | ICD-10-CM | POA: Diagnosis not present

## 2019-03-29 DIAGNOSIS — F4310 Post-traumatic stress disorder, unspecified: Secondary | ICD-10-CM | POA: Diagnosis not present

## 2019-03-29 NOTE — Progress Notes (Signed)
Virtual Visit via Video Note  I connected with Erica Serrano on 03/29/19 at  3:00 PM EST by a video enabled telemedicine application and verified that I am speaking with the correct person using two identifiers.   I discussed the limitations of evaluation and management by telemedicine and the availability of in person appointments. The patient expressed understanding and agreed to proceed.  History of Present Illness: Patient is referred for therapy by [facility] for PTSD, depression and anxiety    Observations/Objective: Patient presents anxious for her webex telehealth session. Patient discussed her psychiatric symptoms and current life events. Patient identified her stressors and current coping skills. Patient is having parental issues with her 3yo daughter and sleeping issues. She is waking up 2-3 x per night to hysterical screaming. Used socratic questions, how is this affect her ptsd symptoms? Patient reports continued conflictual family relationship issues. Patient was tearful in describing her problematic relationships. Used socratic questions. Continued coaching patient on boundarieswith family. Role played boundaries to use with her family. Patient is struggling with self care, not putting herself first. Suggested the importance of self care.  Assessment and Plan: Counselor will continue to meet with patient to address treatment plan goals. Patient will continue to follow recommendations of providers and implement skills learned in session.   Follow Up Instructions: I discussed the assessment and treatment plan with the patient. The patient was provided an opportunity to ask questions and all were answered. The patient agreed with the plan and demonstrated an understanding of the instructions.   The patient was advised to call back or seek an in-person evaluation if the symptoms worsen or if the condition fails to improve as anticipated.  I provided 60 minutes of  non-face-to-face time during this encounter.   Jakala Herford S, LCAS

## 2019-04-05 ENCOUNTER — Ambulatory Visit (INDEPENDENT_AMBULATORY_CARE_PROVIDER_SITE_OTHER): Payer: No Typology Code available for payment source | Admitting: Licensed Clinical Social Worker

## 2019-04-05 ENCOUNTER — Encounter (HOSPITAL_COMMUNITY): Payer: Self-pay | Admitting: Licensed Clinical Social Worker

## 2019-04-05 ENCOUNTER — Other Ambulatory Visit: Payer: Self-pay

## 2019-04-05 DIAGNOSIS — F411 Generalized anxiety disorder: Secondary | ICD-10-CM

## 2019-04-05 DIAGNOSIS — F4310 Post-traumatic stress disorder, unspecified: Secondary | ICD-10-CM | POA: Diagnosis not present

## 2019-04-05 DIAGNOSIS — F321 Major depressive disorder, single episode, moderate: Secondary | ICD-10-CM | POA: Diagnosis not present

## 2019-04-05 NOTE — Progress Notes (Addendum)
Virtual Visit via Video Note  I connected with [name] on 04/05/19 at  3:00 PM EST by a video enabled telemedicine application and verified that I am speaking with the correct person using two identifiers.   I discussed the limitations of evaluation and management by telemedicine and the availability of in person appointments. The patient expressed understanding and agreed to proceed.  History of Present Illness: Patient is referred for therapy by [facility] for PTSD, depression and anxiety  LOCATION: Patient: home Therapist: home   Observations/Objective: Patient presents anxious for her webex telehealth session. Patient discussed her psychiatric symptoms and current life events. Patient identified her stressors and current coping skills. Patient presented frustrated and anxious for her session, apparently having been exposed to Covid at work. "I don't feel protected by my job." Helped patient process and validated her feelings. Patient discussed her communication issues. Assisted patient with effective communication skills. Will continue working with patient on effective communication skills and problem solving skills. Continue to suggest the importance of self care.  Assessment and Plan: Counselor will continue to meet with patient to address treatment plan goals. Patient will continue to follow recommendations of providers and implement skills learned in session.   Follow Up Instructions: I discussed the assessment and treatment plan with the patient. The patient was provided an opportunity to ask questions and all were answered. The patient agreed with the plan and demonstrated an understanding of the instructions.   The patient was advised to call back or seek an in-person evaluation if the symptoms worsen or if the condition fails to improve as anticipated.  I provided 60 minutes of non-face-to-face time during this encounter.   Lerone Onder S, LCAS

## 2019-04-12 ENCOUNTER — Other Ambulatory Visit: Payer: Self-pay

## 2019-04-12 ENCOUNTER — Encounter (HOSPITAL_COMMUNITY): Payer: Self-pay | Admitting: Licensed Clinical Social Worker

## 2019-04-12 ENCOUNTER — Ambulatory Visit (INDEPENDENT_AMBULATORY_CARE_PROVIDER_SITE_OTHER): Payer: No Typology Code available for payment source | Admitting: Licensed Clinical Social Worker

## 2019-04-12 DIAGNOSIS — F4310 Post-traumatic stress disorder, unspecified: Secondary | ICD-10-CM | POA: Diagnosis not present

## 2019-04-12 DIAGNOSIS — F411 Generalized anxiety disorder: Secondary | ICD-10-CM

## 2019-04-12 DIAGNOSIS — F321 Major depressive disorder, single episode, moderate: Secondary | ICD-10-CM | POA: Diagnosis not present

## 2019-04-12 NOTE — Progress Notes (Signed)
Virtual Visit via Video Note  I connected with Erica Serrano on 04/12/19 at  3:00 PM EST by a video enabled telemedicine application and verified that I am speaking with the correct person using two identifiers.   I discussed the limitations of evaluation and management by telemedicine and the availability of in person appointments. The patient expressed understanding and agreed to proceed.  History of Present Illness: Patient is referred for therapy by [facility] for PTSD, depression and anxiety    Observations/Objective: Patient presents anxious for her webex telehealth session. Patient discussed her psychiatric symptoms and current life events. Patient identified her stressors and current coping skills. Patient continues to be frustrated and anxious having been exposed to Covid at work. Used socratic questions to discuss her coping skills used at work for her anxiety. Coached patient on grounding activities, mindfulness skills, relaxation skills. 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 Assisted patient with coping skills for her anxiety, under control by end of session.     Assessment and Plan: Counselor will continue to meet with patient to address treatment plan goals. Patient will continue to follow recommendations of providers and implement skills learned in session.   Follow Up Instructions: I discussed the assessment and treatment plan with the patient. The patient was provided an opportunity to ask questions and all were answered. The patient agreed with the plan and demonstrated an understanding of the instructions.   The patient was advised to call back or seek an in-person evaluation if the symptoms worsen or if the condition fails to improve as anticipated.  I provided 60 minutes of non-face-to-face time during this encounter.   Refael Fulop S, LCAS

## 2019-04-19 ENCOUNTER — Other Ambulatory Visit: Payer: Self-pay

## 2019-04-19 ENCOUNTER — Encounter (HOSPITAL_COMMUNITY): Payer: Self-pay | Admitting: Licensed Clinical Social Worker

## 2019-04-19 ENCOUNTER — Ambulatory Visit (INDEPENDENT_AMBULATORY_CARE_PROVIDER_SITE_OTHER): Payer: No Typology Code available for payment source | Admitting: Licensed Clinical Social Worker

## 2019-04-19 DIAGNOSIS — F411 Generalized anxiety disorder: Secondary | ICD-10-CM | POA: Diagnosis not present

## 2019-04-19 DIAGNOSIS — F321 Major depressive disorder, single episode, moderate: Secondary | ICD-10-CM

## 2019-04-19 DIAGNOSIS — F4310 Post-traumatic stress disorder, unspecified: Secondary | ICD-10-CM | POA: Diagnosis not present

## 2019-04-19 NOTE — Progress Notes (Addendum)
Virtual Visit via Video Note  I connected with [name] on 04/19/19 at  3:00 PM EST by a video enabled telemedicine application and verified that I am speaking with the correct person using two identifiers.   I discussed the limitations of evaluation and management by telemedicine and the availability of in person appointments. The patient expressed understanding and agreed to proceed.  LOCATION: Patient: home Provider: home  History of Present Illness: Patient is referred for therapy by [facility] for PTSD, depression and anxiety    Observations/Objective: Patient presents anxious for her webex telehealth session. Patient discussed her psychiatric symptoms and current life events. Patient identified her stressors and current coping skills. Patient shared she is pregnant and her feelings around it. They are very excited about the pregnancy and having been trying for a year. Patient discussed tearfully her fear of miscarriage. Coached patient on mindfulness skills to assist with her fear, a future feeling. Counselor and patient reviewed relaxation skills. To continue reviewing at next session.      Assessment and Plan: Counselor will continue to meet with patient to address treatment plan goals. Patient will continue to follow recommendations of providers and implement skills learned in session.   Follow Up Instructions: I discussed the assessment and treatment plan with the patient. The patient was provided an opportunity to ask questions and all were answered. The patient agreed with the plan and demonstrated an understanding of the instructions.   The patient was advised to call back or seek an in-person evaluation if the symptoms worsen or if the condition fails to improve as anticipated.  I provided 60 minutes of non-face-to-face time during this encounter.   Elaf Clauson S, LCAS

## 2019-05-03 ENCOUNTER — Ambulatory Visit (INDEPENDENT_AMBULATORY_CARE_PROVIDER_SITE_OTHER): Payer: No Typology Code available for payment source | Admitting: Licensed Clinical Social Worker

## 2019-05-03 ENCOUNTER — Encounter (HOSPITAL_COMMUNITY): Payer: Self-pay | Admitting: Licensed Clinical Social Worker

## 2019-05-03 ENCOUNTER — Other Ambulatory Visit: Payer: Self-pay

## 2019-05-03 DIAGNOSIS — F4310 Post-traumatic stress disorder, unspecified: Secondary | ICD-10-CM

## 2019-05-03 DIAGNOSIS — F321 Major depressive disorder, single episode, moderate: Secondary | ICD-10-CM | POA: Diagnosis not present

## 2019-05-03 DIAGNOSIS — F411 Generalized anxiety disorder: Secondary | ICD-10-CM

## 2019-05-03 NOTE — Progress Notes (Signed)
Virtual Visit via Video Note  I connected with [name] on 05/03/19 at  3:00 PM EST by a video enabled telemedicine application and verified that I am speaking with the correct person using two identifiers.   I discussed the limitations of evaluation and management by telemedicine and the availability of in person appointments. The patient expressed understanding and agreed to proceed.  History of Present Illness: Patient is referred for therapy by [facility] for PTSD, depression and anxiety    Observations/Objective: Patient presents anxious for her webex telehealth session. Patient discussed her psychiatric symptoms and current life events. Patient identified her stressors and current coping skills. Patient shared her increased anxiety symptoms, ruminating negative thoughts, lack of focus. Coached patient on relaxation skills, mindfulness skills, grounding skills. Reviewed emailed informational sheets. Suggested to patient: adult coloring and connect the dots books, phone app for coloring, meditation for anxiety on youtube.    Assessment and Plan: Counselor will continue to meet with patient to address treatment plan goals. Patient will continue to follow recommendations of providers and implement skills learned in session.   Follow Up Instructions: I discussed the assessment and treatment plan with the patient. The patient was provided an opportunity to ask questions and all were answered. The patient agreed with the plan and demonstrated an understanding of the instructions.   The patient was advised to call back or seek an in-person evaluation if the symptoms worsen or if the condition fails to improve as anticipated.  I provided 60 minutes of non-face-to-face time during this encounter.   Samik Balkcom S, LCAS

## 2019-05-10 ENCOUNTER — Encounter (HOSPITAL_COMMUNITY): Payer: Self-pay | Admitting: Licensed Clinical Social Worker

## 2019-05-10 ENCOUNTER — Other Ambulatory Visit: Payer: Self-pay

## 2019-05-10 ENCOUNTER — Ambulatory Visit (INDEPENDENT_AMBULATORY_CARE_PROVIDER_SITE_OTHER): Payer: No Typology Code available for payment source | Admitting: Licensed Clinical Social Worker

## 2019-05-10 DIAGNOSIS — F411 Generalized anxiety disorder: Secondary | ICD-10-CM

## 2019-05-10 DIAGNOSIS — F321 Major depressive disorder, single episode, moderate: Secondary | ICD-10-CM

## 2019-05-10 DIAGNOSIS — F4310 Post-traumatic stress disorder, unspecified: Secondary | ICD-10-CM | POA: Diagnosis not present

## 2019-05-10 NOTE — Progress Notes (Signed)
Virtual Visit via Video Note  I connected with Erica Serrano on 05/10/19 at  3:00 PM EST by a video enabled telemedicine application and verified that I am speaking with the correct person using two identifiers.   I discussed the limitations of evaluation and management by telemedicine and the availability of in person appointments. The patient expressed understanding and agreed to proceed.  History of Present Illness: Patient is referred for therapy by [facility] for PTSD, depression and anxiety    Observations/Objective: Patient presents anxious for her webex telehealth session. Patient discussed her psychiatric symptoms and current life events. Patient identified her stressors and current coping skills. Reviewed tx plan with patient who verbalized her acceptance of the plan. Patient shared her increased anxiety symptoms: ruminating negative thoughts, lack of focus, lack of motivation. Practiced skills in session. Patient reports low frustration tolerance especially in communication with husband. Educated patient on distress tolerance, teaching her how to handle difficult emotions and building high frustration tolerance.  Assessment and Plan: Counselor will continue to meet with patient to address treatment plan goals. Patient will continue to follow recommendations of providers and implement skills learned in session.   Follow Up Instructions: I discussed the assessment and treatment plan with the patient. The patient was provided an opportunity to ask questions and all were answered. The patient agreed with the plan and demonstrated an understanding of the instructions.   The patient was advised to call back or seek an in-person evaluation if the symptoms worsen or if the condition fails to improve as anticipated.  I provided 60 minutes of non-face-to-face time during this encounter.   MACKENZIE,LISBETH S, LCAS

## 2019-05-12 ENCOUNTER — Encounter: Payer: Self-pay | Admitting: Obstetrics & Gynecology

## 2019-05-12 ENCOUNTER — Ambulatory Visit (INDEPENDENT_AMBULATORY_CARE_PROVIDER_SITE_OTHER): Payer: No Typology Code available for payment source | Admitting: Obstetrics & Gynecology

## 2019-05-12 ENCOUNTER — Ambulatory Visit (INDEPENDENT_AMBULATORY_CARE_PROVIDER_SITE_OTHER): Payer: No Typology Code available for payment source

## 2019-05-12 ENCOUNTER — Other Ambulatory Visit: Payer: Self-pay | Admitting: Obstetrics & Gynecology

## 2019-05-12 ENCOUNTER — Other Ambulatory Visit: Payer: Self-pay

## 2019-05-12 ENCOUNTER — Other Ambulatory Visit (HOSPITAL_COMMUNITY)
Admission: RE | Admit: 2019-05-12 | Discharge: 2019-05-12 | Disposition: A | Discharge status: Home / Self Care | Payer: No Typology Code available for payment source | Source: Ambulatory Visit | Attending: Obstetrics & Gynecology | Admitting: Obstetrics & Gynecology

## 2019-05-12 VITALS — BP 100/60 | Wt 173.0 lb

## 2019-05-12 DIAGNOSIS — Z124 Encounter for screening for malignant neoplasm of cervix: Secondary | ICD-10-CM

## 2019-05-12 DIAGNOSIS — Z3A10 10 weeks gestation of pregnancy: Secondary | ICD-10-CM | POA: Diagnosis not present

## 2019-05-12 DIAGNOSIS — N926 Irregular menstruation, unspecified: Secondary | ICD-10-CM

## 2019-05-12 DIAGNOSIS — Z3689 Encounter for other specified antenatal screening: Secondary | ICD-10-CM | POA: Diagnosis not present

## 2019-05-12 DIAGNOSIS — Z23 Encounter for immunization: Secondary | ICD-10-CM | POA: Diagnosis not present

## 2019-05-12 DIAGNOSIS — D6800 Von Willebrand disease, unspecified: Secondary | ICD-10-CM

## 2019-05-12 DIAGNOSIS — Z3A12 12 weeks gestation of pregnancy: Secondary | ICD-10-CM

## 2019-05-12 DIAGNOSIS — O285 Abnormal chromosomal and genetic finding on antenatal screening of mother: Secondary | ICD-10-CM

## 2019-05-12 DIAGNOSIS — Z3490 Encounter for supervision of normal pregnancy, unspecified, unspecified trimester: Secondary | ICD-10-CM

## 2019-05-12 NOTE — Progress Notes (Signed)
05/12/2019   Chief Complaint: Missed period  Transfer of Care Patient: no ([facility] only)  History of Present Illness: Erica Serrano is a 29 y.o. G2P1001 [redacted]w[redacted]d based on Patient's last menstrual period was 02/11/2019. with an Estimated Date of Delivery: 11/18/19, with the above CC.   Her periods were: irregular periods from 2-4 weeks She was using no method, (off of Depo 12 mos) when she conceived.  She has Positive signs or symptoms of nausea/vomiting of pregnancy. She has Negative signs or symptoms of miscarriage or preterm labor She identifies Negative Zika risk factors for her and her partner On any different medications around the time she conceived/early pregnancy: No  History of varicella: Yes   ROS: A 12-point review of systems was performed and negative, except as stated in the above HPI.  OBGYN History: As per HPI. OB History  Gravida Para Term Preterm AB Living  2 1 1     1   SAB TAB Ectopic Multiple Live Births          1    # Outcome Date GA Lbr Len/2nd Weight Sex Delivery Anes PTL Lv  2 Current           1 Term 07/01/15 [redacted]w[redacted]d   F Vag-Spont   LIV    Any issues with any prior pregnancies: vWd, no complications with this during pregnancy and delivery although she did have [place]. Any prior children are healthy, doing well, without any problems or issues: yes History of pap smears: Yes. Last pap smear 2019. Abnormal: no  History of STIs: No   Past Medical History: Past Medical History:  Diagnosis Date  . Depression   . Hemorrhoid   . Migraines   . PTSD (post-traumatic stress disorder)   . Von Willebrand disease ([place]) 01/13/2015    Past Surgical History: Past Surgical History:  Procedure Laterality Date  . nexplanon insertion  2018  . NO PAST SURGERIES      Family History:  Family History  Problem Relation Age of Onset  . Heart disease Father   . Von Willebrand disease Sister   . Cancer Sister        died age 8 - childhood cancer  . Breast  cancer Paternal Aunt 57   She denies any female cancers, bleeding or blood clotting disorders.  She denies any history of mental retardation, birth defects or genetic disorders in her or the FOB's history  Social History:  Social History   Socioeconomic History  . Marital status: Married    Spouse name: Not on file  . Number of children: 1  . Years of education: 8  . Highest education level: Not on file  Occupational History  . Occupation: area Freight forwarder    Comment: [employer]  Tobacco Use  . Smoking status: Never Smoker  . Smokeless tobacco: Never Used  Substance and Sexual Activity  . Alcohol use: No  . Drug use: No  . Sexual activity: Yes    Birth control/protection: Injection  Other Topics Concern  . Not on file  Social History Narrative  . Not on file   Social Determinants of Health   Financial Resource Strain:   . Difficulty of Paying Living Expenses: Not on file  Food Insecurity:   . Worried About Charity fundraiser in the Last Year: Not on file  . Ran Out of Food in the Last Year: Not on file  Transportation Needs:   . Lack of Transportation (Medical): Not  on file  . Lack of Transportation (Non-Medical): Not on file  Physical Activity:   . Days of Exercise per Week: Not on file  . Minutes of Exercise per Session: Not on file  Stress:   . Feeling of Stress : Not on file  Social Connections:   . Frequency of Communication with Friends and Family: Not on file  . Frequency of Social Gatherings with Friends and Family: Not on file  . Attends Religious Services: Not on file  . Active Member of Clubs or Organizations: Not on file  . Attends Banker Meetings: Not on file  . Marital Status: Not on file  Intimate Partner Violence:   . Fear of Current or Ex-Partner: Not on file  . Emotionally Abused: Not on file  . Physically Abused: Not on file  . Sexually Abused: Not on file   Any pets in the household: no  Allergy: No Known Allergies  Current  Outpatient Medications:  Current Outpatient Medications:  .  calcium-vitamin D (OSCAL WITH D) 500-200 MG-UNIT tablet, Take 1 tablet by mouth., Disp: , Rfl:  .  Prenatal Vit-Fe Fumarate-FA (MULTIVITAMIN-PRENATAL) 27-0.8 MG TABS tablet, Take 1 tablet by mouth daily at 12 noon., Disp: , Rfl:  .  hydrOXYzine (VISTARIL) 25 MG capsule, Take 25 mg by mouth as needed., Disp: , Rfl:    Physical Exam:   BP 100/60   Wt 173 lb (78.5 kg)   LMP 02/11/2019   BMI 28.79 kg/m  Body mass index is 28.79 kg/m. Constitutional: Well nourished, well developed female in no acute distress.  Neck:  Supple, normal appearance, and no thyromegaly  Cardiovascular: S1, S2 normal, no murmur, rub or gallop, regular rate and rhythm Respiratory:  Clear to auscultation bilateral. Normal respiratory effort Abdomen: positive bowel sounds and no masses, hernias; diffusely non tender to palpation, non distended Breasts: breasts appear normal, no suspicious masses, no skin or nipple changes or axillary nodes. Neuro/Psych:  Normal mood and affect.  Skin:  Warm and dry.  Lymphatic:  No inguinal lymphadenopathy.   Pelvic exam: is not limited by body habitus EGBUS: within normal limits, Vagina: within normal limits and with no blood in the vault, Cervix: normal appearing cervix without discharge or lesions, closed/long/high, Uterus:  enlarged: 10 weeks, and Adnexa:  normal adnexa  Assessment: Erica Serrano is a 29 y.o. G2P1001 [redacted]w[redacted]d based on Patient's last menstrual period was 02/11/2019. with an Estimated Date of Delivery: 11/18/19, but changed to 10 1/7 weeks w [facility] 12/07/2019 w [name] TODAY,  for prenatal care.  Plan:  1) Avoid alcoholic beverages. 2) Patient encouraged not to smoke.  3) Discontinue the use of all non-medicinal drugs and chemicals.  4) Take prenatal vitamins daily.  5) Seatbelt use advised 6) Nutrition, food safety (fish, cheese advisories, and high nitrite foods) and exercise discussed. 7) Hospital and  practice style delivering at [facility] discussed  8) Patient is asked about travel to areas at risk for the Zika virus, and counseled to avoid travel and exposure to mosquitoes or sexual partners who may have themselves been exposed to the virus. Testing is discussed, and will be ordered as appropriate.  9) Childbirth classes at [facility] advised 10) Genetic Screening, such as with 1st Trimester Screening, cell free fetal DNA, AFP testing, and Ultrasound, as well as with amniocentesis and CVS as appropriate, is discussed with patient. She plans to have genetic testing this pregnancy. 12) Tylenol for headaches.  LFT today 13) [name] today 14) Counseled as to  von Willenbrands disease and pregnancy, no changes from first pregnancy, will follow same protocol. No need for renewed consultations unless new sx's develop 15) Flu shot today  Problem list reviewed and updated.  Annamarie Major, MD, [facility], [facility] 05/12/2019  3:38 PM  ADDENDUM: Review of ULTRASOUND.    I have personally reviewed images and report of recent ultrasound done at [facility].    Plan of management to be discussed with patient.    EDC changed    Healthy singleton IUP    [facility] 12/07/2019  Annamarie Major, MD, [facility], [facility] 05/12/2019  3:39 PM

## 2019-05-12 NOTE — Patient Instructions (Signed)
DUE DATE 12/07/2019 !  Commonly Asked Questions During Pregnancy  Cats: A parasite can be excreted in cat feces.  To avoid exposure you need to have another person empty the little box.  If you must empty the litter box you will need to wear gloves.  Wash your hands after handling your cat.  This parasite can also be found in raw or undercooked meat so this should also be avoided.  Colds, Sore Throats, Flu: Please check your medication sheet to see what you can take for symptoms.  If your symptoms are unrelieved by these medications please call the office.  Dental Work: Most any dental work Agricultural consultant recommends is permitted.  X-rays should only be taken during the first trimester if absolutely necessary.  Your abdomen should be shielded with a lead apron during all x-rays.  Please notify your provider prior to receiving any x-rays.  Novocaine is fine; gas is not recommended.  If your dentist requires a note from [name] prior to dental work please call the office and we will provide one for you.  Exercise: Exercise is an important part of staying healthy during your pregnancy.  You may continue most exercises you were accustomed to prior to pregnancy.  Later in your pregnancy you will most likely notice you have difficulty with activities requiring balance like riding a bicycle.  It is important that you listen to your body and avoid activities that put you at a higher risk of falling.  Adequate rest and staying well hydrated are a must!  If you have questions about the safety of specific activities ask your provider.    Exposure to Children with illness: Try to avoid obvious exposure; report any symptoms to [name] when noted,  If you have chicken pos, red measles or mumps, you should be immune to these diseases.   Please do not take any vaccines while pregnant unless you have checked with your OB provider.  Fetal Movement: After 28 weeks we recommend you do "kick counts" twice daily.  Lie or sit down in a  calm quiet environment and count your baby movements "kicks".  You should feel your baby at least 10 times per hour.  If you have not felt 10 kicks within the first hour get up, walk around and have something sweet to eat or drink then repeat for an additional hour.  If count remains less than 10 per hour notify your provider.  Fumigating: Follow your pest control agent's advice as to how long to stay out of your home.  Ventilate the area well before re-entering.  Hemorrhoids:   Most over-the-counter preparations can be used during pregnancy.  Check your medication to see what is safe to use.  It is important to use a stool softener or fiber in your diet and to drink lots of liquids.  If hemorrhoids seem to be getting worse please call the office.   Hot Tubs:  Hot tubs Jacuzzis and saunas are not recommended while pregnant.  These increase your internal body temperature and should be avoided.  Intercourse:  Sexual intercourse is safe during pregnancy as long as you are comfortable, unless otherwise advised by your provider.  Spotting may occur after intercourse; report any bright red bleeding that is heavier than spotting.  Labor:  If you know that you are in labor, please go to the hospital.  If you are unsure, please call the office and let us help you decide what to do.  Lifting, straining, etc:  If your job requires heavy lifting or straining please check with your provider for any limitations.  Generally, you should not lift items heavier than that you can lift simply with your hands and arms (no back muscles)  Painting:  Paint fumes do not harm your pregnancy, but may make you ill and should be avoided if possible.  Latex or water based paints have less odor than oils.  Use adequate ventilation while painting.  Permanents & Hair Color:  Chemicals in hair dyes are not recommended as they cause increase hair dryness which can increase hair loss during pregnancy.  " Highlighting" and permanents  are allowed.  Dye may be absorbed differently and permanents may not hold as well during pregnancy.  Sunbathing:  Use a sunscreen, as skin burns easily during pregnancy.  Drink plenty of fluids; avoid over heating.  Tanning Beds:  Because their possible side effects are still unknown, tanning beds are not recommended.  Ultrasound Scans:  Routine ultrasounds are performed at approximately 20 weeks.  You will be able to see your baby's general anatomy an if you would like to know the gender this can usually be determined as well.  If it is questionable when you conceived you may also receive an ultrasound early in your pregnancy for dating purposes.  Otherwise ultrasound exams are not routinely performed unless there is a medical necessity.  Although you can request a scan we ask that you pay for it when conducted because insurance does not cover " patient request" scans.  Work: If your pregnancy proceeds without complications you may work until your due date, unless your physician or employer advises otherwise.  Round Ligament Pain/Pelvic Discomfort:  Sharp, shooting pains not associated with bleeding are fairly common, usually occurring in the second trimester of pregnancy.  They tend to be worse when standing up or when you remain standing for long periods of time.  These are the result of pressure of certain pelvic ligaments called "round ligaments".  Rest, Tylenol and heat seem to be the most effective relief.  As the womb and fetus grow, they rise out of the pelvis and the discomfort improves.  Please notify the office if your pain seems different than that described.  It may represent a more serious condition.

## 2019-05-13 LAB — RPR+RH+ABO+RUB AB+AB SCR+CB...
Antibody Screen: NEGATIVE
HIV Screen 4th Generation wRfx: NONREACTIVE
Hematocrit: 29.7 % — ABNORMAL LOW (ref 34.0–46.6)
Hemoglobin: 10.7 g/dL — ABNORMAL LOW (ref 11.1–15.9)
Hepatitis B Surface Ag: NEGATIVE
MCH: 32.4 pg (ref 26.6–33.0)
MCHC: 36 g/dL — ABNORMAL HIGH (ref 31.5–35.7)
MCV: 90 fL (ref 79–97)
Platelets: 65 10*3/uL — CL (ref 150–450)
RBC: 3.3 x10E6/uL — ABNORMAL LOW (ref 3.77–5.28)
RDW: 12.6 % (ref 11.7–15.4)
RPR Ser Ql: NONREACTIVE
Rh Factor: POSITIVE
Rubella Antibodies, IGG: <0.9 index — ABNORMAL LOW (ref >0.99)
Varicella zoster IgG: 452 index (ref >165)
WBC: 4.8 10*3/uL (ref 3.4–10.8)

## 2019-05-13 LAB — CMP AND LIVER
ALT: 10 IU/L (ref 0–32)
AST: 11 IU/L (ref 0–40)
Albumin: 4.3 g/dL (ref 3.9–5.0)
Alkaline Phosphatase: 57 IU/L (ref 39–117)
BUN: 8 mg/dL (ref 6–20)
Bilirubin Total: 0.3 mg/dL (ref 0.0–1.2)
Bilirubin, Direct: 0.1 mg/dL (ref 0.00–0.40)
CO2: 22 mmol/L (ref 20–29)
Calcium: 9.6 mg/dL (ref 8.7–10.2)
Chloride: 101 mmol/L (ref 96–106)
Creatinine, Ser: 0.51 mg/dL — ABNORMAL LOW (ref 0.57–1.00)
GFR calc Af Amer: 150 mL/min/{1.73_m2} (ref >59)
GFR calc non Af Amer: 130 mL/min/{1.73_m2} (ref >59)
Glucose: 80 mg/dL (ref 65–99)
Potassium: 4.5 mmol/L (ref 3.5–5.2)
Sodium: 137 mmol/L (ref 134–144)
Total Protein: 6.6 g/dL (ref 6.0–8.5)

## 2019-05-14 LAB — CYTOLOGY - PAP
Chlamydia: NEGATIVE
Comment: NEGATIVE
Comment: NORMAL
Diagnosis: NEGATIVE
Neisseria Gonorrhea: NEGATIVE

## 2019-05-14 LAB — URINE CULTURE: Organism ID, Bacteria: NO GROWTH

## 2019-05-17 ENCOUNTER — Ambulatory Visit (INDEPENDENT_AMBULATORY_CARE_PROVIDER_SITE_OTHER): Payer: No Typology Code available for payment source | Admitting: Licensed Clinical Social Worker

## 2019-05-17 ENCOUNTER — Other Ambulatory Visit: Payer: Self-pay

## 2019-05-17 ENCOUNTER — Encounter (HOSPITAL_COMMUNITY): Payer: Self-pay | Admitting: Licensed Clinical Social Worker

## 2019-05-17 DIAGNOSIS — F4310 Post-traumatic stress disorder, unspecified: Secondary | ICD-10-CM

## 2019-05-17 DIAGNOSIS — F411 Generalized anxiety disorder: Secondary | ICD-10-CM | POA: Diagnosis not present

## 2019-05-17 DIAGNOSIS — F321 Major depressive disorder, single episode, moderate: Secondary | ICD-10-CM | POA: Diagnosis not present

## 2019-05-17 LAB — MATERNIT21 PLUS CORE+SCA
Fetal Fraction: 13
Monosomy X (Turner Syndrome): NOT DETECTED
Result (T21): NEGATIVE
Trisomy 13 (Patau syndrome): NEGATIVE
Trisomy 18 (Edwards syndrome): NEGATIVE
Trisomy 21 (Down syndrome): NEGATIVE
XXX (Triple X Syndrome): NOT DETECTED
XXY (Klinefelter Syndrome): NOT DETECTED
XYY (Jacobs Syndrome): NOT DETECTED

## 2019-05-17 NOTE — Progress Notes (Signed)
Virtual Visit via Video Note  I connected with Erica Serrano on 05/17/19 at  3:00 PM EST by a video enabled telemedicine application and verified that I am speaking with the correct person using two identifiers.   I discussed the limitations of evaluation and management by telemedicine and the availability of in person appointments. The patient expressed understanding and agreed to proceed.  History of Present Illness: Patient is referred for therapy by [facility] for PTSD, depression and anxiety    Observations/Objective: Patient presents anxious for her webex telehealth session. Patient discussed her psychiatric symptoms and current life events. Patient identified her stressors and current coping skills.Patient finally got in to see her OBGYN to confirm her pregnancy. Her blood work came back with a low platelet count. "this is now my biggest stressor." Reviewed her coping skills. Patient reports her only coping skill that works is avoidance. Educated patient on radical acceptance. Assisted patient practicing radical acceptance.   Assessment and Plan: Counselor will continue to meet with patient to address treatment plan goals. Patient will continue to follow recommendations of providers and implement skills learned in session.   Follow Up Instructions: I discussed the assessment and treatment plan with the patient. The patient was provided an opportunity to ask questions and all were answered. The patient agreed with the plan and demonstrated an understanding of the instructions.   The patient was advised to call back or seek an in-person evaluation if the symptoms worsen or if the condition fails to improve as anticipated.  I provided 60 minutes of non-face-to-face time during this encounter.   Marcell Pfeifer S, LCAS

## 2019-05-24 ENCOUNTER — Other Ambulatory Visit: Payer: Self-pay

## 2019-05-24 ENCOUNTER — Ambulatory Visit (INDEPENDENT_AMBULATORY_CARE_PROVIDER_SITE_OTHER): Payer: No Typology Code available for payment source | Admitting: Licensed Clinical Social Worker

## 2019-05-24 ENCOUNTER — Encounter (HOSPITAL_COMMUNITY): Payer: Self-pay | Admitting: Licensed Clinical Social Worker

## 2019-05-24 DIAGNOSIS — F411 Generalized anxiety disorder: Secondary | ICD-10-CM | POA: Diagnosis not present

## 2019-05-24 DIAGNOSIS — F4310 Post-traumatic stress disorder, unspecified: Secondary | ICD-10-CM

## 2019-05-24 DIAGNOSIS — F321 Major depressive disorder, single episode, moderate: Secondary | ICD-10-CM | POA: Diagnosis not present

## 2019-05-24 NOTE — Progress Notes (Signed)
Virtual Visit via Video Note  I connected with Erica Serrano on 05/24/19 at  3:00 PM EST by a video enabled telemedicine application and verified that I am speaking with the correct person using two identifiers.   I discussed the limitations of evaluation and management by telemedicine and the availability of in person appointments. The patient expressed understanding and agreed to proceed.  History of Present Illness: Patient is referred for therapy by [facility] for PTSD, depression and anxiety    Observations/Objective: Patient presents anxious for her webex telehealth session. Patient discussed her psychiatric symptoms and current life events. Patient identified her stressors and current coping skills. Clinician utilized MI OARS to reflect and summarize thoughts and feelings. Clinician explored updates on the family, as well as concerns at work. Clinician utilized MI OARS to affirm concerns. Clinician challenged thoughts about this. Clinician processed options for communicating her concerns at work and within family unit. Role played effective communication skills using I-statements with family.  Assessment and Plan: Counselor will continue to meet with patient to address treatment plan goals. Patient will continue to follow recommendations of providers and implement skills learned in session.   Follow Up Instructions: I discussed the assessment and treatment plan with the patient. The patient was provided an opportunity to ask questions and all were answered. The patient agreed with the plan and demonstrated an understanding of the instructions.   The patient was advised to call back or seek an in-person evaluation if the symptoms worsen or if the condition fails to improve as anticipated.  I provided 60 minutes of non-face-to-face time during this encounter.   Clance Baquero S, LCAS

## 2019-05-25 ENCOUNTER — Encounter: Payer: Self-pay | Admitting: Advanced Practice Midwife

## 2019-05-25 ENCOUNTER — Other Ambulatory Visit: Payer: Self-pay

## 2019-05-25 ENCOUNTER — Ambulatory Visit (INDEPENDENT_AMBULATORY_CARE_PROVIDER_SITE_OTHER): Payer: No Typology Code available for payment source | Admitting: Advanced Practice Midwife

## 2019-05-25 VITALS — BP 114/70 | Wt 173.0 lb

## 2019-05-25 DIAGNOSIS — Z3A12 12 weeks gestation of pregnancy: Secondary | ICD-10-CM

## 2019-05-25 DIAGNOSIS — Z3481 Encounter for supervision of other normal pregnancy, first trimester: Secondary | ICD-10-CM

## 2019-05-25 DIAGNOSIS — Z3490 Encounter for supervision of normal pregnancy, unspecified, unspecified trimester: Secondary | ICD-10-CM

## 2019-05-25 NOTE — Progress Notes (Signed)
No vb. No lof.  

## 2019-05-25 NOTE — Patient Instructions (Signed)

## 2019-05-25 NOTE — Progress Notes (Addendum)
Routine Prenatal Care Visit  Subjective  Erica Serrano is a 29 y.o. G2P1001 at [redacted]w[redacted]d being seen today for ongoing prenatal care.  She is currently monitored for the following issues for this low-risk pregnancy and has Von Willebrand disease (HCC); Migraine; Abnormal uterine bleeding (AUB); Menorrhagia with irregular cycle; and Encounter for supervision of low-risk pregnancy, antepartum on their problem list.  ----------------------------------------------------------------------------------- Patient reports nausea ongoing. Her migraine frequency has decreased.    . Vag. Bleeding: None.   . Leaking Fluid denies.  ----------------------------------------------------------------------------------- The following portions of the patient's history were reviewed and updated as appropriate: allergies, current medications, past family history, past medical history, past social history, past surgical history and problem list. Problem list updated.  Objective  Blood pressure 114/70, weight 173 lb (78.5 kg), last menstrual period 02/11/2019. Pregravid weight 165 lb (74.8 kg) Total Weight Gain 8 lb (3.629 kg) Urinalysis: Urine Protein    Urine Glucose    Fetal Status: Fetal Heart Rate (bpm): 156         General:  Alert, oriented and cooperative. Patient is in no acute distress.  Skin: Skin is warm and dry. No rash noted.   Cardiovascular: Normal heart rate noted  Respiratory: Normal respiratory effort, no problems with respiration noted  Abdomen: Soft, gravid, appropriate for gestational age.       Pelvic:  Cervical exam deferred        Extremities: Normal range of motion.     Mental Status: Normal mood and affect. Normal behavior. Normal judgment and thought content.   The following were addressed during this visit:  Breastfeeding Education - Early initiation of breastfeeding    Comments: Keeps milk supply adequate, helps contract uterus and slow bleeding, and early milk is the perfect first  food and is easy to digest.   - The importance of exclusive breastfeeding    Comments: Provides antibodies, Lower risk of breast and ovarian cancers, and type-2 diabetes,Helps your body recover, Reduced chance of SIDS.   - Risks of giving your baby anything other than breast milk if you are breastfeeding    Comments: Make the baby less content with breastfeeds, may make my baby more susceptible to illness, and may reduce my milk supply.   - The importance of early skin-to-skin contact    Comments: Keeps baby warm and secure, helps keep baby's blood sugar up and breathing steady, easier to bond and breastfeed, and helps calm baby.  - Rooming-in on a 24-hour basis    Comments: Easier to learn baby's feeding cues, easier to bond and get to know each other, and encourages milk production.   - Feeding on demand or baby-led feeding    Comments: Helps prevent breastfeeding complications, helps bring in good milk supply, prevents under or overfeeding, and helps baby feel content and satisfied   - Frequent feeding to help assure optimal milk production    Comments: Making a full supply of milk requires frequent removal of milk from breasts, infant will eat 8-12 times in 24 hours, if separated from infant use breast massage, hand expression and/ or pumping to remove milk from breasts.   - Effective positioning and attachment    Comments: Helps my baby to get enough breast milk, helps to produce an adequate milk supply, and helps prevent nipple pain and damage   - Exclusive breastfeeding for the first 6 months    Comments: Builds a healthy milk supply and keeps it up, protects baby from sickness and disease, and breastmilk has  everything your baby needs for the first 6 months.     Assessment   29 y.o. G2P1001 at [redacted]w[redacted]d by  12/07/2019, by Ultrasound presenting for routine prenatal visit  Plan   pregnancy2  Problems (from 02/11/19 to present)    No problems associated with this episode.      Repeat platelets today: follow up as needed with hematology consult   Preterm labor symptoms and general obstetric precautions including but not limited to vaginal bleeding, contractions, leaking of fluid and fetal movement were reviewed in detail with the patient. Please refer to After Visit Summary for other counseling recommendations.   Return in about 4 weeks (around 06/22/2019) for rob.    Rod Can, CNM 05/25/2019 11:40 AM

## 2019-05-25 NOTE — Lactation Note (Signed)
Lactation Consultation Note  Patient Name: Erica Serrano RWCHJ'S Date: 05/25/2019     Maternal Data    Feeding    LATCH Score                   Interventions    Lactation Tools Discussed/Used     Consult Status   Lactation student discussed benefits of breastfeeding per the Ready, Set, Baby curriculum. Erica Serrano encouraged to review breastfeeding information on Ready, set, [employer] site and given information for virtual breastfeeding classes.     Erica Serrano 05/25/2019, 11:24 AM

## 2019-05-26 LAB — PLATELET COUNT: Platelets: 178 10*3/uL (ref 150–450)

## 2019-05-31 ENCOUNTER — Ambulatory Visit (HOSPITAL_COMMUNITY): Payer: No Typology Code available for payment source | Admitting: Licensed Clinical Social Worker

## 2019-06-07 ENCOUNTER — Encounter (HOSPITAL_COMMUNITY): Payer: Self-pay | Admitting: Licensed Clinical Social Worker

## 2019-06-07 ENCOUNTER — Other Ambulatory Visit: Payer: Self-pay

## 2019-06-07 ENCOUNTER — Ambulatory Visit (INDEPENDENT_AMBULATORY_CARE_PROVIDER_SITE_OTHER): Payer: No Typology Code available for payment source | Admitting: Licensed Clinical Social Worker

## 2019-06-07 DIAGNOSIS — F4310 Post-traumatic stress disorder, unspecified: Secondary | ICD-10-CM | POA: Diagnosis not present

## 2019-06-07 DIAGNOSIS — F411 Generalized anxiety disorder: Secondary | ICD-10-CM | POA: Diagnosis not present

## 2019-06-07 DIAGNOSIS — F321 Major depressive disorder, single episode, moderate: Secondary | ICD-10-CM | POA: Diagnosis not present

## 2019-06-07 NOTE — Telephone Encounter (Signed)
Please advise 

## 2019-06-07 NOTE — Progress Notes (Signed)
Virtual Visit via Video Note  I connected with Erica Serrano on 06/07/19 at  3:00 PM EDT by a video enabled telemedicine application and verified that I am speaking with the correct person using two identifiers.   I discussed the limitations of evaluation and management by telemedicine and the availability of in person appointments. The patient expressed understanding and agreed to proceed.  History of Present Illness: Patient is referred for therapy by [facility] for PTSD, depression and anxiety    Observations/Objective: Patient presents anxious for her webex telehealth session. Patient discussed her psychiatric symptoms and current life events. Patient identified her stressors and current coping skills. Patient reports she is moving along well with her pregnancy, however she has contracted shingles. Patient discussed her fears and concerns. Validated her feelings. Used cognitive behavioral therapy to assist patient in managing her fears, gradually change the way she thinks, based on the interconnectedness of her thoughts, beliefs, feelings, and behaviors.  Assessment and Plan: Counselor will continue to meet with patient to address treatment plan goals. Patient will continue to follow recommendations of providers and implement skills learned in session.   Follow Up Instructions: I discussed the assessment and treatment plan with the patient. The patient was provided an opportunity to ask questions and all were answered. The patient agreed with the plan and demonstrated an understanding of the instructions.   The patient was advised to call back or seek an in-person evaluation if the symptoms worsen or if the condition fails to improve as anticipated.  I provided 60 minutes of non-face-to-face time during this encounter.   [name] S, LCAS

## 2019-06-14 ENCOUNTER — Other Ambulatory Visit: Payer: Self-pay

## 2019-06-14 ENCOUNTER — Ambulatory Visit (HOSPITAL_COMMUNITY): Payer: No Typology Code available for payment source | Admitting: Licensed Clinical Social Worker

## 2019-06-14 ENCOUNTER — Telehealth (HOSPITAL_COMMUNITY): Payer: Self-pay | Admitting: Licensed Clinical Social Worker

## 2019-06-14 NOTE — Telephone Encounter (Signed)
Pt did not present for her individual webex therapy appointment. Called patient and she was on the way to the grocery store with her daughter and completely forgot about the appointment. Will see her next week for her appointment. Worthy Boschert, lcas

## 2019-06-21 ENCOUNTER — Encounter (HOSPITAL_COMMUNITY): Payer: Self-pay | Admitting: Licensed Clinical Social Worker

## 2019-06-21 ENCOUNTER — Other Ambulatory Visit: Payer: Self-pay

## 2019-06-21 ENCOUNTER — Ambulatory Visit (INDEPENDENT_AMBULATORY_CARE_PROVIDER_SITE_OTHER): Payer: No Typology Code available for payment source | Admitting: Licensed Clinical Social Worker

## 2019-06-21 DIAGNOSIS — F411 Generalized anxiety disorder: Secondary | ICD-10-CM

## 2019-06-21 DIAGNOSIS — F321 Major depressive disorder, single episode, moderate: Secondary | ICD-10-CM

## 2019-06-21 DIAGNOSIS — F4310 Post-traumatic stress disorder, unspecified: Secondary | ICD-10-CM | POA: Diagnosis not present

## 2019-06-21 NOTE — Progress Notes (Addendum)
Virtual Visit via Video Note  I connected with Erica Serrano on 06/21/19 at  3:00 PM EDT by a video enabled telemedicine application and verified that I am speaking with the correct person using two identifiers.   I discussed the limitations of evaluation and management by telemedicine and the availability of in person appointments. The patient expressed understanding and agreed to proceed.  History of Present Illness: Patient is referred for therapy by [facility] for PTSD, depression and anxiety    Observations/Objective: Patient presents anxious for her webex telehealth session. Patient discussed her psychiatric symptoms and current life events. Patient identified her stressors and current coping skills. Patient is experiencing increased stress due to family drama.  Coached patient on drama triangle. Marland Kitchen Provided psychoeducation on dynamics of healthy vs unhealthy relationships associated with power and control and to focus to things she can control or start  developing alternative coping skills   PLAN: 21 days of gratitude emailed   Assessment and Plan: Counselor will continue to meet with patient to address treatment plan goals. Patient will continue to follow recommendations of providers and implement skills learned in session.   Follow Up Instructions: I discussed the assessment and treatment plan with the patient. The patient was provided an opportunity to ask questions and all were answered. The patient agreed with the plan and demonstrated an understanding of the instructions.   The patient was advised to call back or seek an in-person evaluation if the symptoms worsen or if the condition fails to improve as anticipated.  I provided 60 minutes of non-face-to-face time during this encounter.   Erica Serrano S, LCAS

## 2019-06-22 ENCOUNTER — Ambulatory Visit (INDEPENDENT_AMBULATORY_CARE_PROVIDER_SITE_OTHER): Payer: No Typology Code available for payment source | Admitting: Advanced Practice Midwife

## 2019-06-22 ENCOUNTER — Encounter: Payer: Self-pay | Admitting: Advanced Practice Midwife

## 2019-06-22 ENCOUNTER — Other Ambulatory Visit: Payer: Self-pay

## 2019-06-22 VITALS — BP 112/72 | Wt 178.0 lb

## 2019-06-22 DIAGNOSIS — Z3482 Encounter for supervision of other normal pregnancy, second trimester: Secondary | ICD-10-CM

## 2019-06-22 DIAGNOSIS — Z3A16 16 weeks gestation of pregnancy: Secondary | ICD-10-CM

## 2019-06-22 DIAGNOSIS — Z3480 Encounter for supervision of other normal pregnancy, unspecified trimester: Secondary | ICD-10-CM

## 2019-06-22 LAB — POCT URINALYSIS DIPSTICK OB
Glucose, UA: NEGATIVE
POC,PROTEIN,UA: NEGATIVE

## 2019-06-22 NOTE — Progress Notes (Signed)
ROB

## 2019-06-22 NOTE — Progress Notes (Signed)
  Routine Prenatal Care Visit  Subjective  Erica Serrano is a 29 y.o. G2P1001 at [redacted]w[redacted]d being seen today for ongoing prenatal care.  She is currently monitored for the following issues for this low-risk pregnancy and has Von Willebrand disease (HCC); Migraine; Abnormal uterine bleeding (AUB); Menorrhagia with irregular cycle; and Supervision of normal pregnancy on their problem list.  ----------------------------------------------------------------------------------- Patient reports no complaints.    . Vag. Bleeding: None.  Movement: Absent. Leaking Fluid denies.  ----------------------------------------------------------------------------------- The following portions of the patient's history were reviewed and updated as appropriate: allergies, current medications, past family history, past medical history, past social history, past surgical history and problem list. Problem list updated.  Objective  Blood pressure 112/72, weight 178 lb (80.7 kg), last menstrual period 02/11/2019. Pregravid weight 165 lb (74.8 kg) Total Weight Gain 13 lb (5.897 kg) Urinalysis: Urine Protein Negative  Urine Glucose Negative  Fetal Status: Fetal Heart Rate (bpm): 146   Movement: Absent     General:  Alert, oriented and cooperative. Patient is in no acute distress.  Skin: Skin is warm and dry. No rash noted.   Cardiovascular: Normal heart rate noted  Respiratory: Normal respiratory effort, no problems with respiration noted  Abdomen: Soft, gravid, appropriate for gestational age. Pain/Pressure: Absent     Pelvic:  Cervical exam deferred        Extremities: Normal range of motion.     Mental Status: Normal mood and affect. Normal behavior. Normal judgment and thought content.   Assessment   29 y.o. G2P1001 at [redacted]w[redacted]d by  12/07/2019, by Ultrasound presenting for routine prenatal visit  Plan   pregnancy2  Problems (from 02/11/19 to present)    Problem Noted Resolved   Supervision of normal pregnancy  05/12/2019 by Nadara Mustard, MD No   Overview Signed 05/12/2019  2:46 PM by Nadara Mustard, MD    [facility]inic [facility] Prenatal Labs  Dating [name] 12 weeks Blood type:     Genetic Screen      NIPS: Antibody:   Anatomic [name]  Rubella:   Varicella: @VZVIGG @  GTT  Third trimester:  RPR:     Rhogam n/a HBsAg:     TDaP vaccine          Flu Shot:05/12/19 HIV:     Baby Food  Breast GBS:   Contraception  Depo Pap: 05/12/2019  CBB  No   CS/VBAC n/a   Support Person 05/14/2019, husband               Preterm labor symptoms and general obstetric precautions including but not limited to vaginal bleeding, contractions, leaking of fluid and fetal movement were reviewed in detail with the patient.   Return in about 4 weeks (around 07/20/2019) for anatomy scan and rob.  07/22/2019, CNM 06/22/2019 11:25 AM

## 2019-06-28 ENCOUNTER — Encounter (HOSPITAL_COMMUNITY): Payer: Self-pay | Admitting: Licensed Clinical Social Worker

## 2019-06-28 ENCOUNTER — Ambulatory Visit (INDEPENDENT_AMBULATORY_CARE_PROVIDER_SITE_OTHER): Payer: No Typology Code available for payment source | Admitting: Licensed Clinical Social Worker

## 2019-06-28 ENCOUNTER — Other Ambulatory Visit: Payer: Self-pay

## 2019-06-28 DIAGNOSIS — F321 Major depressive disorder, single episode, moderate: Secondary | ICD-10-CM | POA: Diagnosis not present

## 2019-06-28 DIAGNOSIS — F411 Generalized anxiety disorder: Secondary | ICD-10-CM

## 2019-06-28 DIAGNOSIS — F4310 Post-traumatic stress disorder, unspecified: Secondary | ICD-10-CM

## 2019-06-28 NOTE — Progress Notes (Signed)
Virtual Visit via Video Note  I connected with Erica Serrano on 06/28/19 at  3:00 PM EDT by a video enabled telemedicine application and verified that I am speaking with the correct person using two identifiers.   I discussed the limitations of evaluation and management by telemedicine and the availability of in person appointments. The patient expressed understanding and agreed to proceed.  History of Present Illness: Patient is referred for therapy by [facility] for PTSD, depression and anxiety    Observations/Objective: Patient presents anxious for her webex telehealth session. Patient discussed her psychiatric symptoms and current life events. Patient identified her stressors and current coping skills. Pt reports the continued family dysfunction. Assessed patient's family dynamics and discussed how the dynamics influence her symptoms in a negative way. Used CBT to help her learn to cope with her family stressors through active problem solving, how to monitor and regulate her thoughts and moods. Coached patient on using boundaries with her family. Patient is hesitant to use boundaries because they are her family.  PLAN: 21 days of gratitude emailed   Assessment and Plan: Counselor will continue to meet with patient to address treatment plan goals. Patient will continue to follow recommendations of providers and implement skills learned in session.   Follow Up Instructions: I discussed the assessment and treatment plan with the patient. The patient was provided an opportunity to ask questions and all were answered. The patient agreed with the plan and demonstrated an understanding of the instructions.   The patient was advised to call back or seek an in-person evaluation if the symptoms worsen or if the condition fails to improve as anticipated.  I provided 60 minutes of non-face-to-face time during this encounter.   Brodee Mauritz S, LCAS

## 2019-07-05 ENCOUNTER — Other Ambulatory Visit: Payer: Self-pay

## 2019-07-05 ENCOUNTER — Ambulatory Visit (INDEPENDENT_AMBULATORY_CARE_PROVIDER_SITE_OTHER): Payer: No Typology Code available for payment source | Admitting: Licensed Clinical Social Worker

## 2019-07-05 ENCOUNTER — Encounter (HOSPITAL_COMMUNITY): Payer: Self-pay | Admitting: Licensed Clinical Social Worker

## 2019-07-05 DIAGNOSIS — F411 Generalized anxiety disorder: Secondary | ICD-10-CM

## 2019-07-05 DIAGNOSIS — F321 Major depressive disorder, single episode, moderate: Secondary | ICD-10-CM

## 2019-07-05 DIAGNOSIS — F4310 Post-traumatic stress disorder, unspecified: Secondary | ICD-10-CM | POA: Diagnosis not present

## 2019-07-05 NOTE — Progress Notes (Signed)
Virtual Visit via Video Note  I connected with Erica Serrano on 07/05/19 at  3:00 PM EDT by a video enabled telemedicine application and verified that I am speaking with the correct person using two identifiers.   I discussed the limitations of evaluation and management by telemedicine and the availability of in person appointments. The patient expressed understanding and agreed to proceed.  History of Present Illness: Patient is referred for therapy by [facility] for PTSD, depression and anxiety    Observations/Objective: Patient presents anxious for her webex telehealth session. Patient discussed her psychiatric symptoms and current life events. Patient identified her stressors and current coping skills. Patient reports her husband received his 100% disability from the [place]. Pt has now decided to proceed with trying to increase her disability rating. Discussed options. Patient, again, discussed her family dynamics and decisions about communication with her family. Clinician utilized CBT to address her thought processes. Clinician provided thought stopping tools, as well as reality testing to provide support and confidence in her decisions.     PLAN: 21 days of gratitude emailed   Assessment and Plan: Counselor will continue to meet with patient to address treatment plan goals. Patient will continue to follow recommendations of providers and implement skills learned in session.   Follow Up Instructions: I discussed the assessment and treatment plan with the patient. The patient was provided an opportunity to ask questions and all were answered. The patient agreed with the plan and demonstrated an understanding of the instructions.   The patient was advised to call back or seek an in-person evaluation if the symptoms worsen or if the condition fails to improve as anticipated.  I provided 60 minutes of non-face-to-face time during this encounter.   Aprill Banko S, LCAS

## 2019-07-12 ENCOUNTER — Ambulatory Visit (HOSPITAL_COMMUNITY): Payer: No Typology Code available for payment source | Admitting: Licensed Clinical Social Worker

## 2019-07-19 ENCOUNTER — Encounter (HOSPITAL_COMMUNITY): Payer: Self-pay | Admitting: Licensed Clinical Social Worker

## 2019-07-19 ENCOUNTER — Ambulatory Visit (INDEPENDENT_AMBULATORY_CARE_PROVIDER_SITE_OTHER): Payer: No Typology Code available for payment source | Admitting: Licensed Clinical Social Worker

## 2019-07-19 ENCOUNTER — Other Ambulatory Visit: Payer: Self-pay

## 2019-07-19 DIAGNOSIS — F411 Generalized anxiety disorder: Secondary | ICD-10-CM

## 2019-07-19 DIAGNOSIS — F4310 Post-traumatic stress disorder, unspecified: Secondary | ICD-10-CM | POA: Diagnosis not present

## 2019-07-19 DIAGNOSIS — F321 Major depressive disorder, single episode, moderate: Secondary | ICD-10-CM | POA: Diagnosis not present
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
## 2019-07-20 ENCOUNTER — Ambulatory Visit: Payer: No Typology Code available for payment source

## 2019-07-20 ENCOUNTER — Encounter: Payer: No Typology Code available for payment source | Admitting: Obstetrics & Gynecology

## 2019-07-26 ENCOUNTER — Ambulatory Visit (INDEPENDENT_AMBULATORY_CARE_PROVIDER_SITE_OTHER): Payer: No Typology Code available for payment source

## 2019-07-26 ENCOUNTER — Other Ambulatory Visit: Payer: Self-pay

## 2019-07-26 ENCOUNTER — Ambulatory Visit (INDEPENDENT_AMBULATORY_CARE_PROVIDER_SITE_OTHER): Payer: No Typology Code available for payment source | Admitting: Obstetrics & Gynecology

## 2019-07-26 ENCOUNTER — Encounter: Payer: Self-pay | Admitting: Obstetrics & Gynecology

## 2019-07-26 VITALS — BP 102/60 | Wt 188.0 lb

## 2019-07-26 DIAGNOSIS — Z131 Encounter for screening for diabetes mellitus: Secondary | ICD-10-CM

## 2019-07-26 DIAGNOSIS — Z3A20 20 weeks gestation of pregnancy: Secondary | ICD-10-CM

## 2019-07-26 DIAGNOSIS — R5383 Other fatigue: Secondary | ICD-10-CM

## 2019-07-26 DIAGNOSIS — Z3482 Encounter for supervision of other normal pregnancy, second trimester: Secondary | ICD-10-CM

## 2019-07-26 DIAGNOSIS — D6800 Von Willebrand disease, unspecified: Secondary | ICD-10-CM

## 2019-07-26 DIAGNOSIS — Z3480 Encounter for supervision of other normal pregnancy, unspecified trimester: Secondary | ICD-10-CM

## 2019-07-26 NOTE — Patient Instructions (Signed)

## 2019-07-26 NOTE — Progress Notes (Signed)
  Subjective  Fetal Movement? yes Contractions? no Leaking Fluid? no Vaginal Bleeding? no  Objective  BP 102/60   Wt 188 lb (85.3 kg)   LMP 02/11/2019   BMI 31.28 kg/m  General: NAD Pumonary: no increased work of breathing Abdomen: gravid, non-tender Extremities: no edema Psychiatric: mood appropriate, affect full  Review of ULTRASOUND. I have personally reviewed images and report of recent ultrasound done at [facility]. There is a singleton gestation with subjectively normal amniotic fluid volume. The fetal biometry correlates with established dating. Detailed evaluation of the fetal anatomy was performed.The fetal anatomical survey appears within normal limits within the resolution of ultrasound as described above.  It must be noted that a normal ultrasound is unable to rule out fetal aneuploidy.    Assessment  29 y.o. G2P1001 at [redacted]w[redacted]d by  12/07/2019, by Ultrasound presenting for routine prenatal visit  Plan   Problem List Items Addressed This Visit      Hematopoietic and Hemostatic   Von Willebrand disease (HCC)     Other   Supervision of normal pregnancy    Other Visit Diagnoses    [redacted] weeks gestation of pregnancy    -  Primary   Fatigue, unspecified type       Relevant Orders   CBC   Screening for diabetes mellitus       Relevant Orders   28 Week RH+Panel      pregnancy2  Problems (from 02/11/19 to present)    Problem Noted Resolved   Supervision of normal pregnancy 05/12/2019 by Nadara Mustard, MD No   Overview Addendum 07/26/2019  3:59 PM by Nadara Mustard, MD    Clinic [facility] Prenatal Labs  Dating [name] 12 weeks Blood type: O/Positive/-- (02/17 1552)   Genetic Screen      NIPS: Antibody:Negative (02/17 1552)  Anatomic [name] WSOB nml Rubella: <0.90 (02/17 1552) = NonImmune.  Varicella: IMMune  GTT  Third trimester:  RPR: Non Reactive (02/17 1552)   Rhogam n/a HBsAg: Negative (02/17 1552)   TDaP vaccine          Flu Shot:05/12/19 HIV: Non Reactive (02/17 1552)    Baby Food  Breast GBS:   Contraception  Depo Pap: 05/12/2019  CBB  No   CS/VBAC n/a   Support Person Shawn, husband               Annamarie Major, MD, [facility], [facility] 07/26/2019  4:12 PM

## 2019-07-27 LAB — CBC
Hematocrit: 39 % (ref 34.0–46.6)
Hemoglobin: 13.3 g/dL (ref 11.1–15.9)
MCH: 31.4 pg (ref 26.6–33.0)
MCHC: 34.1 g/dL (ref 31.5–35.7)
MCV: 92 fL (ref 79–97)
Platelets: 156 10*3/uL (ref 150–450)
RBC: 4.23 x10E6/uL (ref 3.77–5.28)
RDW: 12.6 % (ref 11.7–15.4)
WBC: 7.9 10*3/uL (ref 3.4–10.8)

## 2019-08-02 ENCOUNTER — Ambulatory Visit (INDEPENDENT_AMBULATORY_CARE_PROVIDER_SITE_OTHER): Payer: No Typology Code available for payment source | Admitting: Licensed Clinical Social Worker

## 2019-08-02 ENCOUNTER — Encounter (HOSPITAL_COMMUNITY): Payer: Self-pay | Admitting: Licensed Clinical Social Worker

## 2019-08-02 ENCOUNTER — Other Ambulatory Visit: Payer: Self-pay

## 2019-08-02 DIAGNOSIS — F321 Major depressive disorder, single episode, moderate: Secondary | ICD-10-CM

## 2019-08-02 DIAGNOSIS — F4310 Post-traumatic stress disorder, unspecified: Secondary | ICD-10-CM

## 2019-08-02 DIAGNOSIS — F411 Generalized anxiety disorder: Secondary | ICD-10-CM

## 2019-08-02 NOTE — Progress Notes (Signed)
Virtual Visit via Video Note  I connected with Erica Serrano on 08/02/19 at 10:00 AM EDT by a video enabled telemedicine application and verified that I am speaking with the correct person using two identifiers.   I discussed the limitations of evaluation and management by telemedicine and the availability of in person appointments. The patient expressed understanding and agreed to proceed.  History of Present Illness: Patient is referred for therapy by [facility] for PTSD, depression and anxiety    Observations/Objective: Patient presents anxious for her telehealth session. Patient discussed her psychiatric symptoms and current life events. Patient identified her stressors and current coping skills. Reviewed treatment plan with patient who verbalized her acceptance of the plan.  Pt described how her irritability and low frustration tolerance has increased. Used socratic questions. Cln provided psychoeducation on increasing low her low frustration tolerance which leads to her irritability, using radical acceptance. Patient also discussed her daughter's low frustration tolerance. Emailed patient informational sheet on low frustration tolerance in children.        PLAN: Rating? 21 days of gratitude emailed   Assessment and Plan: Counselor will continue to meet with patient to address treatment plan goals. Patient will continue to follow recommendations of providers and implement skills learned in session.   Follow Up Instructions: I discussed the assessment and treatment plan with the patient. The patient was provided an opportunity to ask questions and all were answered. The patient agreed with the plan and demonstrated an understanding of the instructions.   The patient was advised to call back or seek an in-person evaluation if the symptoms worsen or if the condition fails to improve as anticipated.  I provided 60 minutes of non-face-to-face time during this  encounter.   Bettina Warn S, LCAS

## 2019-08-09 ENCOUNTER — Other Ambulatory Visit: Payer: Self-pay

## 2019-08-09 ENCOUNTER — Ambulatory Visit (HOSPITAL_COMMUNITY): Payer: No Typology Code available for payment source | Admitting: Licensed Clinical Social Worker

## 2019-08-16 ENCOUNTER — Other Ambulatory Visit: Payer: Self-pay

## 2019-08-16 ENCOUNTER — Encounter (HOSPITAL_COMMUNITY): Payer: Self-pay | Admitting: Licensed Clinical Social Worker

## 2019-08-16 ENCOUNTER — Ambulatory Visit (INDEPENDENT_AMBULATORY_CARE_PROVIDER_SITE_OTHER): Payer: No Typology Code available for payment source | Admitting: Licensed Clinical Social Worker

## 2019-08-16 DIAGNOSIS — F4310 Post-traumatic stress disorder, unspecified: Secondary | ICD-10-CM

## 2019-08-16 DIAGNOSIS — F411 Generalized anxiety disorder: Secondary | ICD-10-CM | POA: Diagnosis not present

## 2019-08-16 DIAGNOSIS — F321 Major depressive disorder, single episode, moderate: Secondary | ICD-10-CM

## 2019-08-16 NOTE — Progress Notes (Signed)
Virtual Visit via Video Note  I connected with [name] on 08/16/19 at 10:00 AM EDT by a video enabled telemedicine application and verified that I am speaking with the correct person using two identifiers.   I discussed the limitations of evaluation and management by telemedicine and the availability of in person appointments. The patient expressed understanding and agreed to proceed.  LOCATION: Patient: Home Provider: Home  History of Present Illness: Patient is referred for therapy by [facility] for PTSD, depression and anxiety    Observations/Objective: Patient presents anxious for her telehealth session. Patient discussed her psychiatric symptoms and current life events. Patient identified her stressors and current coping skills. Patient discussed the passing of her aunt, whom she was very close with. Patient was tearful in her discussion of her relationship with her aunt. Validated her feelings. Patient discussed her strained family relationships, which were exacerbated during the funeral. Assessed patient'Serrano family dynamics and discussed how the dynamics influence her mental health symptoms in a negative way. Used CBT to teach her how to use her coping skills in dealing with her family dysfunction.        Plan: low frustration tolerance and child'Serrano low frustration tolerance (emailed handouts)       PLAN: Rating? 21 days of gratitude emailed   Assessment and Plan: Counselor will continue to meet with patient to address treatment plan goals. Patient will continue to follow recommendations of providers and implement skills learned in session.   Follow Up Instructions: I discussed the assessment and treatment plan with the patient. The patient was provided an opportunity to ask questions and all were answered. The patient agreed with the plan and demonstrated an understanding of the instructions.   The patient was advised to call back or seek an in-person evaluation  if the symptoms worsen or if the condition fails to improve as anticipated.  I provided 60 minutes of non-face-to-face time during this encounter.   Erica Serrano, LCAS

## 2019-08-24 ENCOUNTER — Encounter: Payer: Self-pay | Admitting: Advanced Practice Midwife

## 2019-08-24 ENCOUNTER — Telehealth (INDEPENDENT_AMBULATORY_CARE_PROVIDER_SITE_OTHER): Payer: No Typology Code available for payment source | Admitting: Advanced Practice Midwife

## 2019-08-24 DIAGNOSIS — Z3A25 25 weeks gestation of pregnancy: Secondary | ICD-10-CM | POA: Diagnosis not present

## 2019-08-24 DIAGNOSIS — Z3482 Encounter for supervision of other normal pregnancy, second trimester: Secondary | ICD-10-CM

## 2019-08-24 NOTE — Progress Notes (Signed)
Routine Prenatal Care Visit- Virtual Visit  Subjective   Video visit  I connected with Erica Serrano on 08/24/19 at  10:05 AM EDT by telephone and verified that I am speaking with the correct person using two identifiers.   I discussed the limitations, risks, security and privacy concerns of performing an evaluation and management service by telephone and the availability of in person appointments. I also discussed with the patient that there may be a patient responsible charge related to this service. The patient expressed understanding and agreed to proceed.  The patient was in the car on her way [place] the [place] with her [place] I spoke with the patient from my  Office phone The names of people involved in this encounter were: [name], her husband and daughter and myself  Erica Serrano is a 29 y.o. G2P1001 at [redacted]w[redacted]d being seen today for ongoing prenatal care.  She is currently monitored for the following issues for this low-risk pregnancy and has Von Willebrand disease (HCC); Migraine; Abnormal uterine bleeding (AUB); Menorrhagia with irregular cycle; and Supervision of normal pregnancy on their problem list.  ----------------------------------------------------------------------------------- Patient reports swelling hemorrhoids.    . Vag. Bleeding: None.  Movement: Present. Denies leaking of fluid.  ----------------------------------------------------------------------------------- The following portions of the patient's history were reviewed and updated as appropriate: allergies, current medications, past family history, past medical history, past social history, past surgical history and problem list. Problem list updated.   Objective  Last menstrual period 02/11/2019. Pregravid weight 165 lb (74.8 kg) Total Weight Gain 23 lb (10.4 kg) Urinalysis:      Fetal Status:     Movement: Present      Well appearing female in no acute distress  Physical Exam could not be performed.  Because of the COVID-19 outbreak this visit was performed by video and not in person.   Assessment   29 y.o. G2P1001 at [redacted]w[redacted]d by  12/07/2019, by Ultrasound presenting for routine prenatal visit  Plan   pregnancy2  Problems (from 02/11/19 to present)    Problem Noted Resolved   Supervision of normal pregnancy 05/12/2019 by Nadara Mustard, MD No   Overview Addendum 07/26/2019  3:59 PM by Nadara Mustard, MD    Clinic [facility] Prenatal Labs  Dating [name] 12 weeks Blood type: O/Positive/-- (02/17 1552)   Genetic Screen      NIPS: Antibody:Negative (02/17 1552)  Anatomic [name] WSOB nml Rubella: <0.90 (02/17 1552) = NonImmune.  Varicella: IMMune  GTT  Third trimester:  RPR: Non Reactive (02/17 1552)   Rhogam n/a HBsAg: Negative (02/17 1552)   TDaP vaccine          Flu Shot:05/12/19 HIV: Non Reactive (02/17 1552)   Baby Food  Breast GBS:   Contraception  Depo Pap: 05/12/2019  CBB  No   CS/VBAC n/a   Support Person [name], husband               Gestational age appropriate obstetric precautions including but not limited to vaginal bleeding, contractions, leaking of fluid and fetal movement were reviewed in detail with the patient.     Follow Up Instructions: Hemorrhoids: increase hydration, fiber, use stool softener as needed, don't strain with BM, use OTC hemorrhoid medications as needed   I discussed the assessment and treatment plan with the patient. The patient was provided an opportunity to ask questions and all were answered. The patient agreed with the plan and demonstrated an understanding of the instructions.   The patient was  advised to call back or seek an in-person evaluation if the symptoms worsen or if the condition fails to improve as anticipated.  I provided 10 minutes of video face-to-face time during this encounter.  Return for has follow up scheduled.  Christean Leaf, CNM [facility] 08/24/19, 10:19 AM

## 2019-08-30 ENCOUNTER — Ambulatory Visit (HOSPITAL_COMMUNITY): Payer: No Typology Code available for payment source | Admitting: Licensed Clinical Social Worker

## 2019-08-30 ENCOUNTER — Other Ambulatory Visit: Payer: Self-pay

## 2019-08-31 ENCOUNTER — Other Ambulatory Visit: Payer: Self-pay

## 2019-08-31 ENCOUNTER — Ambulatory Visit (INDEPENDENT_AMBULATORY_CARE_PROVIDER_SITE_OTHER): Payer: No Typology Code available for payment source | Admitting: Licensed Clinical Social Worker

## 2019-08-31 ENCOUNTER — Encounter (HOSPITAL_COMMUNITY): Payer: Self-pay | Admitting: Licensed Clinical Social Worker

## 2019-08-31 DIAGNOSIS — F4310 Post-traumatic stress disorder, unspecified: Secondary | ICD-10-CM

## 2019-08-31 DIAGNOSIS — F321 Major depressive disorder, single episode, moderate: Secondary | ICD-10-CM | POA: Diagnosis not present

## 2019-08-31 DIAGNOSIS — F411 Generalized anxiety disorder: Secondary | ICD-10-CM | POA: Diagnosis not present

## 2019-08-31 NOTE — Progress Notes (Signed)
Virtual Visit via Video Note  I connected with [name] on 08/31/19 at  4:00 PM EDT by a video enabled telemedicine application and verified that I am speaking with the correct person using two identifiers.   I discussed the limitations of evaluation and management by telemedicine and the availability of in person appointments. The patient expressed understanding and agreed to proceed.  LOCATION: Patient: car Provider: Home  History of Present Illness: Patient is referred for therapy by [facility] for PTSD, depression and anxiety    Observations/Objective: Patient presents anxious for her telehealth session. Patient discussed her psychiatric symptoms and current life events. Patient identified her stressors and current coping skills. Patient was frustrated and anxious upon presentation. "I got my rating from the [place], it was increased a little but a lot was denied. Her migraine dx was denied, not service related. Pt asked clinician to write a letter for the [place], stating that her migraines were a direct cause of her PTSD. Explained to patient I do not have the expertise to make that statement. Patient was not happy and became more frustrated. The session began to freeze, after several attempts clinician left the session and came back in but patient had left the session. Emailed patient asking her to return to the session. Patient did not join the session.   Plan: low frustration tolerance and child's low frustration tolerance (emailed handouts)       PLAN: Rating? 21 days of gratitude emailed   Assessment and Plan: Counselor will continue to meet with patient to address treatment plan goals. Patient will continue to follow recommendations of providers and implement skills learned in session.   Follow Up Instructions: I discussed the assessment and treatment plan with the patient. The patient was provided an opportunity to ask questions and all were answered. The patient agreed  with the plan and demonstrated an understanding of the instructions.   The patient was advised to call back or seek an in-person evaluation if the symptoms worsen or if the condition fails to improve as anticipated.  I provided 25 minutes of non-face-to-face time during this encounter.   Kyannah Climer S, LCAS

## 2019-09-06 ENCOUNTER — Other Ambulatory Visit: Payer: Self-pay

## 2019-09-06 ENCOUNTER — Ambulatory Visit (HOSPITAL_COMMUNITY): Payer: No Typology Code available for payment source | Admitting: Licensed Clinical Social Worker

## 2019-09-13 ENCOUNTER — Other Ambulatory Visit: Payer: Self-pay

## 2019-09-13 ENCOUNTER — Ambulatory Visit (INDEPENDENT_AMBULATORY_CARE_PROVIDER_SITE_OTHER): Payer: No Typology Code available for payment source | Admitting: Licensed Clinical Social Worker

## 2019-09-13 ENCOUNTER — Encounter (HOSPITAL_COMMUNITY): Payer: Self-pay | Admitting: Licensed Clinical Social Worker

## 2019-09-13 DIAGNOSIS — F411 Generalized anxiety disorder: Secondary | ICD-10-CM | POA: Diagnosis not present

## 2019-09-13 DIAGNOSIS — F4310 Post-traumatic stress disorder, unspecified: Secondary | ICD-10-CM | POA: Diagnosis not present

## 2019-09-13 DIAGNOSIS — F321 Major depressive disorder, single episode, moderate: Secondary | ICD-10-CM

## 2019-09-13 NOTE — Progress Notes (Signed)
Virtual Visit via Video Note  I connected with Erica Serrano on 09/13/19 at  2:00 PM EDT by a video enabled telemedicine application and verified that I am speaking with the correct person using two identifiers.   I discussed the limitations of evaluation and management by telemedicine and the availability of in person appointments. The patient expressed understanding and agreed to proceed.  LOCATION: Patient: home Provider: Home  History of Present Illness: Patient is referred for therapy by [facility] for PTSD, depression and anxiety        Observations/Objective: Patient presents anxious for her telehealth session. Patient discussed her psychiatric symptoms and current life events. Patient identified her stressors and current coping skills. Patient reports she will be seeing a PTSD therapist at the [place] beginning in August. She is unsure if she will still have community care after that. Patient reports lots of stress and anxiety since last session, due to house hunting, being pregnant, selling or renting her house,  Arguing with husband. Used socratic questions. Discussed coping skills with patient: diversion. "That's what works for me!" Discussed future appointments.         PLAN:  21 days of gratitude emailed   Assessment and Plan: Counselor will continue to meet with patient to address treatment plan goals. Patient will continue to follow recommendations of providers and implement skills learned in session.   Follow Up Instructions: I discussed the assessment and treatment plan with the patient. The patient was provided an opportunity to ask questions and all were answered. The patient agreed with the plan and demonstrated an understanding of the instructions.   The patient was advised to call back or seek an in-person evaluation if the symptoms worsen or if the condition fails to improve as anticipated.  I provided 60 minutes of non-face-to-face time during this  encounter.   Glenis Musolf S, LCAS

## 2019-09-20 ENCOUNTER — Ambulatory Visit (HOSPITAL_COMMUNITY): Payer: No Typology Code available for payment source | Admitting: Licensed Clinical Social Worker

## 2019-09-20 ENCOUNTER — Other Ambulatory Visit: Payer: Self-pay

## 2019-09-21 ENCOUNTER — Other Ambulatory Visit: Payer: Self-pay

## 2019-09-21 ENCOUNTER — Other Ambulatory Visit: Payer: No Typology Code available for payment source

## 2019-09-21 ENCOUNTER — Ambulatory Visit (INDEPENDENT_AMBULATORY_CARE_PROVIDER_SITE_OTHER): Payer: No Typology Code available for payment source | Admitting: Obstetrics and Gynecology

## 2019-09-21 VITALS — BP 110/76 | Wt 202.0 lb

## 2019-09-21 DIAGNOSIS — Z3A29 29 weeks gestation of pregnancy: Secondary | ICD-10-CM

## 2019-09-21 DIAGNOSIS — Z131 Encounter for screening for diabetes mellitus: Secondary | ICD-10-CM

## 2019-09-21 DIAGNOSIS — Z3483 Encounter for supervision of other normal pregnancy, third trimester: Secondary | ICD-10-CM

## 2019-09-21 NOTE — Progress Notes (Signed)
ROB 28 week labs 

## 2019-09-22 LAB — 28 WEEK RH+PANEL
Basophils Absolute: 0 10*3/uL (ref 0.0–0.2)
Basos: 0 %
EOS (ABSOLUTE): 0.1 10*3/uL (ref 0.0–0.4)
Eos: 1 %
Gestational Diabetes Screen: 116 mg/dL (ref 65–139)
HIV Screen 4th Generation wRfx: NONREACTIVE
Hematocrit: 35.6 % (ref 34.0–46.6)
Hemoglobin: 11.9 g/dL (ref 11.1–15.9)
Immature Grans (Abs): 0 10*3/uL (ref 0.0–0.1)
Immature Granulocytes: 0 %
Lymphocytes Absolute: 1.5 10*3/uL (ref 0.7–3.1)
Lymphs: 22 %
MCH: 29.7 pg (ref 26.6–33.0)
MCHC: 33.4 g/dL (ref 31.5–35.7)
MCV: 89 fL (ref 79–97)
Monocytes Absolute: 0.4 10*3/uL (ref 0.1–0.9)
Monocytes: 6 %
Neutrophils Absolute: 4.8 10*3/uL (ref 1.4–7.0)
Neutrophils: 71 %
Platelets: 143 10*3/uL — ABNORMAL LOW (ref 150–450)
RBC: 4.01 x10E6/uL (ref 3.77–5.28)
RDW: 12.1 % (ref 11.7–15.4)
RPR Ser Ql: NONREACTIVE
WBC: 6.8 10*3/uL (ref 3.4–10.8)

## 2019-09-29 ENCOUNTER — Other Ambulatory Visit: Payer: Self-pay

## 2019-09-29 ENCOUNTER — Ambulatory Visit (INDEPENDENT_AMBULATORY_CARE_PROVIDER_SITE_OTHER): Payer: No Typology Code available for payment source | Admitting: Licensed Clinical Social Worker

## 2019-09-29 ENCOUNTER — Encounter (HOSPITAL_COMMUNITY): Payer: Self-pay | Admitting: Licensed Clinical Social Worker

## 2019-09-29 DIAGNOSIS — F411 Generalized anxiety disorder: Secondary | ICD-10-CM | POA: Diagnosis not present

## 2019-09-29 DIAGNOSIS — F321 Major depressive disorder, single episode, moderate: Secondary | ICD-10-CM | POA: Diagnosis not present

## 2019-09-29 DIAGNOSIS — F4310 Post-traumatic stress disorder, unspecified: Secondary | ICD-10-CM

## 2019-09-29 NOTE — Progress Notes (Signed)
Virtual Visit via Video Note  I connected with Erica Serrano on 09/29/19 at  3:00 PM EDT by a video enabled telemedicine application and verified that I am speaking with the correct person using two identifiers.   I discussed the limitations of evaluation and management by telemedicine and the availability of in person appointments. The patient expressed understanding and agreed to proceed.  LOCATION: Patient: home Provider: Home  History of Present Illness: Patient is referred for therapy by [facility] for PTSD, depression and anxiety        Observations/Objective: Patient presents anxious for her telehealth session. Patient discussed her psychiatric symptoms and current life events. Patient identified her stressors and current coping skills. Patient provided updates on buying/selling houses, pregnancy, stressors. 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 Role played scenarios of current stressors. Made new appointments.        Assessment and Plan: Counselor will continue to meet with patient to address treatment plan goals. Patient will continue to follow recommendations of providers and implement skills learned in session.   Follow Up Instructions: I discussed the assessment and treatment plan with the patient. The patient was provided an opportunity to ask questions and all were answered. The patient agreed with the plan and demonstrated an understanding of the instructions.   The patient was advised to call back or seek an in-person evaluation if the symptoms worsen or if the condition fails to improve as anticipated.  I provided 45 minutes of non-face-to-face time during this encounter.   Erica Serrano S, LCAS

## 2019-10-02 NOTE — Progress Notes (Signed)
    Routine Prenatal Care Visit  Subjective  Erica Serrano is a 29 y.o. G2P1001 at [redacted]w[redacted]d being seen today for ongoing prenatal care.  She is currently monitored for the following issues for this low-risk pregnancy and has Von Willebrand disease (HCC); Migraine; Abnormal uterine bleeding (AUB); Menorrhagia with irregular cycle; and Supervision of normal pregnancy on their problem list.  ----------------------------------------------------------------------------------- Patient reports no complaints.   Contractions: Not present. Vag. Bleeding: None.  Movement: Present. Denies leaking of fluid.  ----------------------------------------------------------------------------------- The following portions of the patient's history were reviewed and updated as appropriate: allergies, current medications, past family history, past medical history, past social history, past surgical history and problem list. Problem list updated.   Objective  Blood pressure 110/76, weight 202 lb (91.6 kg), last menstrual period 02/11/2019. Pregravid weight 165 lb (74.8 kg) Total Weight Gain 37 lb (16.8 kg) Urinalysis:      Fetal Status: Fetal Heart Rate (bpm): 145   Movement: Present     General:  Alert, oriented and cooperative. Patient is in no acute distress.  Skin: Skin is warm and dry. No rash noted.   Cardiovascular: Normal heart rate noted  Respiratory: Normal respiratory effort, no problems with respiration noted  Abdomen: Soft, gravid, appropriate for gestational age. Pain/Pressure: Absent     Pelvic:  Cervical exam deferred        Extremities: Normal range of motion.     ental Status: Normal mood and affect. Normal behavior. Normal judgment and thought content.     Assessment   29 y.o. G2P1001 at [redacted]w[redacted]d d by  12/07/2019, by Ultrasound presenting for routine prenatal visit  Plan   pregnancy2  Problems (from 02/11/19 to present)    Problem Noted Resolved   Supervision of normal pregnancy 05/12/2019  by Nadara Mustard, MD No   Overview Addendum 07/26/2019  3:59 PM by Nadara Mustard, MD    Clinic [facility] Prenatal Labs  Dating [name] 12 weeks Blood type: O/Positive/-- (02/17 1552)   Genetic Screen      NIPS: Antibody:Negative (02/17 1552)  Anatomic [name] WSOB nml Rubella: <0.90 (02/17 1552) = NonImmune.  Varicella: IMMune  GTT  Third trimester:  RPR: Non Reactive (02/17 1552)   Rhogam n/a HBsAg: Negative (02/17 1552)   TDaP vaccine          Flu Shot:05/12/19 HIV: Non Reactive (02/17 1552)   Baby Food  Breast GBS:   Contraception  Depo Pap: 05/12/2019  CBB  No   CS/VBAC n/a   Support Person [name], husband           Previous Version       Gestational age appropriate obstetric precautions including but not limited to vaginal bleeding, contractions, leaking of fluid and fetal movement were reviewed in detail with the patient.    Return in about 2 weeks (around 10/05/2019) for ROB.  Vena Austria, MD, [facility], [facility]

## 2019-10-11 ENCOUNTER — Encounter: Payer: Self-pay | Admitting: Obstetrics and Gynecology

## 2019-10-11 ENCOUNTER — Observation Stay
Admission: EM | Admit: 2019-10-11 | Discharge: 2019-10-11 | Disposition: A | Discharge status: Home / Self Care | Payer: No Typology Code available for payment source | Attending: Obstetrics and Gynecology | Admitting: Obstetrics and Gynecology

## 2019-10-11 ENCOUNTER — Encounter: Payer: Self-pay | Admitting: Certified Nurse Midwife

## 2019-10-11 ENCOUNTER — Other Ambulatory Visit: Payer: Self-pay | Admitting: Advanced Practice Midwife

## 2019-10-11 ENCOUNTER — Other Ambulatory Visit: Payer: Self-pay

## 2019-10-11 ENCOUNTER — Ambulatory Visit (INDEPENDENT_AMBULATORY_CARE_PROVIDER_SITE_OTHER): Payer: No Typology Code available for payment source | Admitting: Certified Nurse Midwife

## 2019-10-11 ENCOUNTER — Ambulatory Visit (HOSPITAL_COMMUNITY): Payer: No Typology Code available for payment source | Admitting: Licensed Clinical Social Worker

## 2019-10-11 VITALS — BP 110/70 | Wt 204.0 lb

## 2019-10-11 DIAGNOSIS — O368390 Maternal care for abnormalities of the fetal heart rate or rhythm, unspecified trimester, not applicable or unspecified: Secondary | ICD-10-CM

## 2019-10-11 DIAGNOSIS — Z3A31 31 weeks gestation of pregnancy: Secondary | ICD-10-CM

## 2019-10-11 DIAGNOSIS — O99113 Other diseases of the blood and blood-forming organs and certain disorders involving the immune mechanism complicating pregnancy, third trimester: Secondary | ICD-10-CM | POA: Diagnosis not present

## 2019-10-11 DIAGNOSIS — Z3483 Encounter for supervision of other normal pregnancy, third trimester: Secondary | ICD-10-CM

## 2019-10-11 DIAGNOSIS — D680 Von Willebrand's disease: Secondary | ICD-10-CM | POA: Insufficient documentation

## 2019-10-11 DIAGNOSIS — O368330 Maternal care for abnormalities of the fetal heart rate or rhythm, third trimester, not applicable or unspecified: Secondary | ICD-10-CM | POA: Diagnosis not present

## 2019-10-11 DIAGNOSIS — Z79899 Other long term (current) drug therapy: Secondary | ICD-10-CM | POA: Insufficient documentation

## 2019-10-11 DIAGNOSIS — O0993 Supervision of high risk pregnancy, unspecified, third trimester: Secondary | ICD-10-CM

## 2019-10-11 LAB — POCT URINALYSIS DIPSTICK OB
Glucose, UA: NEGATIVE
POC,PROTEIN,UA: NEGATIVE

## 2019-10-11 NOTE — OB Triage Note (Signed)
Pt sent from office for further monitoring due to irregular heart rate heard via doppler.

## 2019-10-11 NOTE — Progress Notes (Signed)
ROB at 31wk6d: Having more pelvic pain and pressure. Pains shoot down in groin and righ leg when bending over. Increasingly difficult to do job duties. Increased stress regarding move after delivery and building a new house.  Would like to start maternity leave next week. Continues to see a therapist for anxiety/ depression/ PTSD Good fetal movement  Exam: FH 35. FHT irregular, sometimes 125 then drops to 60s for several seconds, then back to 120's. Baby movement palpated during this time. BP 110/70 negative proteinuria Wt 204# (TWG 39#)  A: IUP at 31wk 6 days with fetal heart rate abnormality  P: TO L&D for monitoring TDAP next visit. Follow up pending evaluation in L&D  Farrel Conners, CNM   Addendum: Seen in L&D and fetal arrhythmia heard/ documented. TO have MFM consult this week. Patient would like to start maternity leave this week. Sent letter in [employer]. Farrel Conners, CNM

## 2019-10-11 NOTE — Progress Notes (Signed)
C/o usual pain; pain pelvic c numbness and sharp pain going down to her knees.

## 2019-10-11 NOTE — Progress Notes (Signed)
Pt given discharge instructions along with follow up care and labor precautions. Pt verbalized understanding.  

## 2019-10-11 NOTE — Discharge Summary (Signed)
Physician Final Progress Note  Patient ID: Erica Serrano MRN: [id number] DOB/AGE: 29-12-1990 29 y.o.  Admit date: 10/11/2019 Admitting provider: Tresea Mall, CNM Discharge date: 10/11/2019   Admission Diagnoses: sent from office for evaluation of irregular fetal heart rate  Discharge Diagnoses:  Active Problems:   Labor and delivery, indication for care   [redacted] weeks gestation of pregnancy Fetal arrhythmia  History of Present Illness: The patient is a 29 y.o. female G2P1001 at [redacted]w[redacted]d who presents from the office for irregular fetal heart rate heard by doppler. Patient was admitted for observation and placed on monitors. She admits good fetal movement. She denies contractions, leakage of fluid or vaginal bleeding. She denies any s/s of illness.   Tracing was reactive and category I with some audible irregularities and normal tracing initially. Arrhythmia audible and visible after some time being traced.  Baseline 130 bpm with intermittent brief tracings around 60 bpm. Discussed NST with Dr Jerene Pitch who agrees patient should have follow up with MFM for most likely transient fetal arrhythmia. MFM is not in clinic today and will be in tomorrow. Referral sent for patient to follow up with MFM either tomorrow or Thursday of this week. Patient is discharged with instructions and precautions. She requests work letter to be out of work beginning this week.   Past Medical History:  Diagnosis Date   Depression    Hemorrhoid    Migraines    PTSD (post-traumatic stress disorder)    Von Willebrand disease (HCC) 01/13/2015    Past Surgical History:  Procedure Laterality Date   nexplanon insertion  2018   NO PAST SURGERIES      No current facility-administered medications on file prior to encounter.   Current Outpatient Medications on File Prior to Encounter  Medication Sig Dispense Refill   calcium-vitamin D (OSCAL WITH D) 500-200 MG-UNIT tablet Take 1 tablet by mouth.     Prenatal  Vit-Fe Fumarate-FA (MULTIVITAMIN-PRENATAL) 27-0.8 MG TABS tablet Take 1 tablet by mouth daily at 12 noon.     vitamin B-12 (CYANOCOBALAMIN) 500 MCG tablet Take 500 mcg by mouth daily.      No Known Allergies  Social History   Socioeconomic History   Marital status: Married    Spouse name: Not on file   Number of children: 1   Years of education: 16   Highest education level: Not on file  Occupational History   Occupation: area Production designer, theatre/television/film    Comment: [employer]  Tobacco Use   Smoking status: Never Smoker   Smokeless tobacco: Never Used  Building services engineer Use: Never used  Substance and Sexual Activity   Alcohol use: No   Drug use: No   Sexual activity: Yes    Birth control/protection: Injection  Other Topics Concern   Not on file  Social History Narrative   Not on file   Social Determinants of Health   Financial Resource Strain:    Difficulty of Paying Living Expenses:   Food Insecurity:    Worried About Programme researcher, broadcasting/film/video in the Last Year:    Barista in the Last Year:   Transportation Needs:    Freight forwarder (Medical):    Lack of Transportation (Non-Medical):   Physical Activity:    Days of Exercise per Week:    Minutes of Exercise per Session:   Stress:    Feeling of Stress :   Social Connections:    Frequency of Communication with Friends and Family:  Frequency of Social Gatherings with Friends and Family:    Attends Religious Services:    Active Member of Clubs or Organizations:    Attends Engineer, structural:    Marital Status:   Intimate Partner Violence:    Fear of Current or Ex-Partner:    Emotionally Abused:    Physically Abused:    Sexually Abused:     Family History  Problem Relation Age of Onset   Heart disease Father    Von Willebrand disease Sister    Cancer Sister        died age 35 - childhood cancer   Breast cancer Paternal Aunt 45     Review of Systems  Constitutional:  Negative for chills and fever.  HENT: Negative for congestion, ear discharge, ear pain, hearing loss, sinus pain and sore throat.   Eyes: Negative for blurred vision and double vision.  Respiratory: Negative for cough, shortness of breath and wheezing.   Cardiovascular: Negative for chest pain, palpitations and leg swelling.  Gastrointestinal: Negative for abdominal pain, blood in stool, constipation, diarrhea, heartburn, melena, nausea and vomiting.  Genitourinary: Negative for dysuria, flank pain, frequency, hematuria and urgency.  Musculoskeletal: Negative for back pain, joint pain and myalgias.  Skin: Negative for itching and rash.  Neurological: Negative for dizziness, tingling, tremors, sensory change, speech change, focal weakness, seizures, loss of consciousness, weakness and headaches.  Endo/Heme/Allergies: Negative for environmental allergies. Does not bruise/bleed easily.  Psychiatric/Behavioral: Negative for depression, hallucinations, memory loss, substance abuse and suicidal ideas. The patient is not nervous/anxious and does not have insomnia.      Physical Exam: BP 101/61 (BP Location: Right Arm)    Pulse 83    Temp 98.6 F (37 C) (Oral)    Resp 17    Ht 5\' 5"  (1.651 m)    Wt 92.5 kg    LMP 02/11/2019    BMI 33.93 kg/m   Constitutional: Well nourished, well developed female in no acute distress.  HEENT: normal Skin: Warm and dry.  Cardiovascular: Regular rate and rhythm.   Extremity: no edema  Respiratory: Clear to auscultation bilateral. Normal respiratory effort Abdomen: FHT present Back: no CVAT Neuro: DTRs 2+, Cranial nerves grossly intact Psych: Alert and Oriented x3. No memory deficits. Normal mood and affect.  MS: normal gait, normal bilateral lower extremity ROM/strength/stability.  Pelvic exam: deferred Toco: negative for contractions  Fetal well being: category II tracing, 130 bpm baseline with change to 50-80 bpm for a few seconds every 2-20 minutes,  moderate variability, +accelerations, no other decelerations seen  Consults: outpatient MFM referral requested  Significant Findings/ Diagnostic Studies: none  Procedures: NST  Hospital Course: The patient was admitted to Labor and Delivery Triage for observation.   Discharge Condition: good  Disposition: Discharge disposition: 01-Home or Self Care  Diet: Regular diet  Discharge Activity: Activity as tolerated  Discharge Instructions    Discharge activity:  No Restrictions   Complete by: As directed    Discharge diet:  No restrictions   Complete by: As directed    Fetal Kick Count:  Lie on our left side for one hour after a meal, and count the number of times your baby kicks.  If it is less than 5 times, get up, move around and drink some juice.  Repeat the test 30 minutes later.  If it is still less than 5 kicks in an hour, notify your doctor.   Complete by: As directed    No sexual activity  restrictions   Complete by: As directed    Notify physician for a general feeling that "something is not right"   Complete by: As directed    Notify physician for increase or change in vaginal discharge   Complete by: As directed    Notify physician for intestinal cramps, with or without diarrhea, sometimes described as "gas pain"   Complete by: As directed    Notify physician for leaking of fluid   Complete by: As directed    Notify physician for low, dull backache, unrelieved by heat or Tylenol   Complete by: As directed    Notify physician for menstrual like cramps   Complete by: As directed    Notify physician for pelvic pressure   Complete by: As directed    Notify physician for uterine contractions.  These may be painless and feel like the uterus is tightening or the baby is  "balling up"   Complete by: As directed    Notify physician for vaginal bleeding   Complete by: As directed    PRETERM LABOR:  Includes any of the follwing symptoms that occur between 20 - [redacted] weeks  gestation.  If these symptoms are not stopped, preterm labor can result in preterm delivery, placing your baby at risk   Complete by: As directed      Allergies as of 10/11/2019   No Known Allergies     Medication List    STOP taking these medications   Vistaril 25 MG capsule Generic drug: hydrOXYzine     TAKE these medications   calcium-vitamin D 500-200 MG-UNIT tablet Commonly known as: OSCAL WITH D Take 1 tablet by mouth.   multivitamin-prenatal 27-0.8 MG Tabs tablet Take 1 tablet by mouth daily at 12 noon.   vitamin B-12 500 MCG tablet Commonly known as: CYANOCOBALAMIN Take 500 mcg by mouth daily.       Follow-up Information    [facility]. Go to.   Specialty: Obstetrics and Gynecology Why: scheduled prenatal appointment Contact information: [address] [place] [address] [phone]              Total time spent taking care of this patient: 40 minutes  Signed: Tresea Mall, CNM  10/11/2019, 3:49 PM

## 2019-10-11 NOTE — Progress Notes (Signed)
Referral to MFM sent for appointment this week regarding fetal arrhythmia heard in clinic and at L&D on Monday 10/11/2019. Out of work letter beginning this week written for patient.

## 2019-10-12 ENCOUNTER — Ambulatory Visit: Payer: No Typology Code available for payment source

## 2019-10-12 ENCOUNTER — Other Ambulatory Visit: Payer: Self-pay | Admitting: Advanced Practice Midwife

## 2019-10-12 ENCOUNTER — Other Ambulatory Visit: Payer: Self-pay

## 2019-10-12 ENCOUNTER — Ambulatory Visit: Payer: No Typology Code available for payment source | Attending: Advanced Practice Midwife

## 2019-10-12 DIAGNOSIS — Z3483 Encounter for supervision of other normal pregnancy, third trimester: Secondary | ICD-10-CM

## 2019-10-12 DIAGNOSIS — Z3A31 31 weeks gestation of pregnancy: Secondary | ICD-10-CM

## 2019-10-12 DIAGNOSIS — Z3A32 32 weeks gestation of pregnancy: Secondary | ICD-10-CM | POA: Diagnosis not present

## 2019-10-12 DIAGNOSIS — O368330 Maternal care for abnormalities of the fetal heart rate or rhythm, third trimester, not applicable or unspecified: Secondary | ICD-10-CM | POA: Insufficient documentation

## 2019-10-12 DIAGNOSIS — O368390 Maternal care for abnormalities of the fetal heart rate or rhythm, unspecified trimester, not applicable or unspecified: Secondary | ICD-10-CM

## 2019-10-12 NOTE — Progress Notes (Signed)
Maternal Fetal Care @ [facility] NST Reactive Started @ 1541  Ended @ [phone] FHT 130's

## 2019-10-14 ENCOUNTER — Other Ambulatory Visit: Payer: No Typology Code available for payment source

## 2019-10-18 ENCOUNTER — Other Ambulatory Visit: Payer: Self-pay

## 2019-10-18 ENCOUNTER — Encounter (HOSPITAL_COMMUNITY): Payer: Self-pay | Admitting: Licensed Clinical Social Worker

## 2019-10-18 ENCOUNTER — Ambulatory Visit (INDEPENDENT_AMBULATORY_CARE_PROVIDER_SITE_OTHER): Payer: No Typology Code available for payment source | Admitting: Licensed Clinical Social Worker

## 2019-10-18 DIAGNOSIS — F321 Major depressive disorder, single episode, moderate: Secondary | ICD-10-CM | POA: Diagnosis not present

## 2019-10-18 DIAGNOSIS — F4310 Post-traumatic stress disorder, unspecified: Secondary | ICD-10-CM

## 2019-10-18 DIAGNOSIS — F411 Generalized anxiety disorder: Secondary | ICD-10-CM | POA: Diagnosis not present

## 2019-10-18 NOTE — Progress Notes (Signed)
Virtual Visit via Video Note  I connected with Erica Serrano on 10/18/19 at  1:00 PM EDT by a video enabled telemedicine application and verified that I am speaking with the correct person using two identifiers.   I discussed the limitations of evaluation and management by telemedicine and the availability of in person appointments. The patient expressed understanding and agreed to proceed.  LOCATION: Patient: home Provider: Home  History of Present Illness: Patient is referred for therapy by [facility] for PTSD, depression and anxiety        Observations/Objective: Patient presents anxious for her telehealth session. Patient discussed her psychiatric symptoms and current life events. Patient identified her stressors and current coping skills. Patient provided updates on buying/selling houses, pregnancy, stressors. Patient has experienced the baby's heart irregular heartbeat, which has increased her anxiety. Buying/selling the house has been stressful. Patient discussed the disappointment she is feeling about the distant relationship between she and her mother. Patient discussed her lack of support except her husband, who is also experiencing anxiety due to his own stressors. Patient discussed her coping skills that are working.    PLAN: Patient will call in August to make a new appointment after her move.       Assessment and Plan: Counselor will continue to meet with patient to address treatment plan goals. Patient will continue to follow recommendations of providers and implement skills learned in session.   Follow Up Instructions: I discussed the assessment and treatment plan with the patient. The patient was provided an opportunity to ask questions and all were answered. The patient agreed with the plan and demonstrated an understanding of the instructions.   The patient was advised to call back or seek an in-person evaluation if the symptoms worsen or if the condition  fails to improve as anticipated.  I provided 60 minutes of non-face-to-face time during this encounter.   Brisa Auth S, LCAS

## 2019-10-20 ENCOUNTER — Other Ambulatory Visit: Payer: Self-pay

## 2019-10-20 ENCOUNTER — Ambulatory Visit (INDEPENDENT_AMBULATORY_CARE_PROVIDER_SITE_OTHER): Payer: No Typology Code available for payment source | Admitting: Advanced Practice Midwife

## 2019-10-20 ENCOUNTER — Encounter: Payer: Self-pay | Admitting: Advanced Practice Midwife

## 2019-10-20 VITALS — BP 100/69 | HR 84 | Wt 204.0 lb

## 2019-10-20 DIAGNOSIS — O368390 Maternal care for abnormalities of the fetal heart rate or rhythm, unspecified trimester, not applicable or unspecified: Secondary | ICD-10-CM

## 2019-10-20 DIAGNOSIS — Z3A33 33 weeks gestation of pregnancy: Secondary | ICD-10-CM

## 2019-10-20 DIAGNOSIS — Z3483 Encounter for supervision of other normal pregnancy, third trimester: Secondary | ICD-10-CM

## 2019-10-20 DIAGNOSIS — Z23 Encounter for immunization: Secondary | ICD-10-CM

## 2019-10-20 LAB — FETAL NONSTRESS TEST

## 2019-10-20 NOTE — Progress Notes (Signed)
ROB NST 

## 2019-10-20 NOTE — Addendum Note (Signed)
Addended by: [place], Soul Deveney B on: 10/20/2019 02:00 PM   Modules accepted: Orders

## 2019-10-20 NOTE — Progress Notes (Addendum)
  Routine Prenatal Care Visit  Subjective  Erica Serrano is a 29 y.o. G2P1001 at [redacted]w[redacted]d being seen today for ongoing prenatal care.  She is currently monitored for the following issues for this low-risk pregnancy and has Von Willebrand disease (HCC); Migraine; Abnormal uterine bleeding (AUB); Menorrhagia with irregular cycle; Supervision of normal pregnancy; Labor and delivery, indication for care; [redacted] weeks gestation of pregnancy; and Fetal arrhythmia affecting pregnancy, antepartum on their problem list.  ----------------------------------------------------------------------------------- Patient reports increase in shortness of breath. Her anxiety aggravates the feeling causing some chest tightness. She has good coping techniques and reassurance is given that she should be getting enough oxygen.   Contractions: Not present. Vag. Bleeding: None.  Movement: Present. Leaking Fluid denies.  ----------------------------------------------------------------------------------- The following portions of the patient's history were reviewed and updated as appropriate: allergies, current medications, past family history, past medical history, past social history, past surgical history and problem list. Problem list updated.  Objective  Blood pressure 100/69, pulse 84, weight (!) 204 lb (92.5 kg), last menstrual period 02/11/2019. Pregravid weight 165 lb (74.8 kg) Total Weight Gain 39 lb (17.7 kg) Urinalysis: Urine Protein    Urine Glucose    Fetal Status: Fetal Heart Rate (bpm): 130   Movement: Present      NST: reactive 20 minute tracing, 130 bpm baseline, moderate variability, +accelerations, a couple of variables  General:  Alert, oriented and cooperative. Patient is in no acute distress.  Skin: Skin is warm and dry. No rash noted.   Cardiovascular: Normal heart rate noted  Respiratory: Normal respiratory effort, no problems with respiration noted  Abdomen: Soft, gravid, appropriate for gestational  age. Pain/Pressure: Absent     Pelvic:  Cervical exam deferred        Extremities: Normal range of motion.  Edema: None  Mental Status: Normal mood and affect. Normal behavior. Normal judgment and thought content.   Assessment   29 y.o. G2P1001 at [redacted]w[redacted]d by  12/07/2019, by Ultrasound presenting for routine prenatal visit  Plan   pregnancy2  Problems (from 02/11/19 to present)    Problem Noted Resolved   Supervision of normal pregnancy 05/12/2019 by Nadara Mustard, MD No   Overview Addendum 07/26/2019  3:59 PM by Nadara Mustard, MD    Clinic [facility] Prenatal Labs  Dating [name] 12 weeks Blood type: O/Positive/-- (02/17 1552)   Genetic Screen      NIPS: Antibody:Negative (02/17 1552)  Anatomic [name] [facility] nml Rubella: <0.90 (02/17 1552) = NonImmune.  Varicella: IMMune  GTT  Third trimester:  RPR: Non Reactive (02/17 1552)   Rhogam n/a HBsAg: Negative (02/17 1552)   Vaccines TDAP: 10/20/19          Flu Shot: 05/12/19 HIV: Non Reactive (02/17 1552)   Baby Food  Breast GBS:   Contraception  Depo Pap: 05/12/2019  CBB  No   CS/VBAC n/a   Support Person [name], husband           Previous Version       Preterm labor symptoms and general obstetric precautions including but not limited to vaginal bleeding, contractions, leaking of fluid and fetal movement were reviewed in detail with the patient. Please refer to After Visit Summary for other counseling recommendations.   Return in about 1 week (around 10/27/2019) for nst and rob.  Tresea Mall, CNM 10/20/2019 11:51 AM

## 2019-10-28 ENCOUNTER — Other Ambulatory Visit: Payer: Self-pay | Admitting: Advanced Practice Midwife

## 2019-10-28 ENCOUNTER — Encounter: Payer: No Typology Code available for payment source | Admitting: Obstetrics and Gynecology

## 2019-10-28 DIAGNOSIS — O368390 Maternal care for abnormalities of the fetal heart rate or rhythm, unspecified trimester, not applicable or unspecified: Secondary | ICD-10-CM

## 2019-11-01 ENCOUNTER — Ambulatory Visit: Payer: No Typology Code available for payment source | Attending: Obstetrics and Gynecology

## 2019-11-01 ENCOUNTER — Other Ambulatory Visit: Payer: Self-pay

## 2019-11-01 ENCOUNTER — Encounter: Payer: No Typology Code available for payment source | Admitting: Obstetrics and Gynecology

## 2019-11-01 ENCOUNTER — Telehealth: Payer: Self-pay | Admitting: Obstetrics & Gynecology

## 2019-11-01 DIAGNOSIS — O368330 Maternal care for abnormalities of the fetal heart rate or rhythm, third trimester, not applicable or unspecified: Secondary | ICD-10-CM

## 2019-11-01 DIAGNOSIS — Z3A34 34 weeks gestation of pregnancy: Secondary | ICD-10-CM

## 2019-11-01 DIAGNOSIS — O368390 Maternal care for abnormalities of the fetal heart rate or rhythm, unspecified trimester, not applicable or unspecified: Secondary | ICD-10-CM

## 2019-11-01 IMAGING — US US MFM OB FOLLOW-UP
2 series · 14 of 28 positions shown · non-contrast
Comparison: none

[Series 1: us mfm ob follow-up · 32 acquisitions, 13 frames shown (1 of 2)]
[im 2/32]
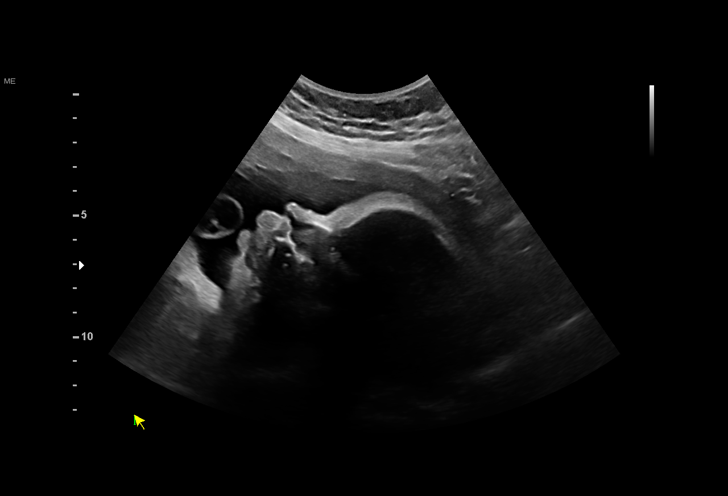
[im 4/32]
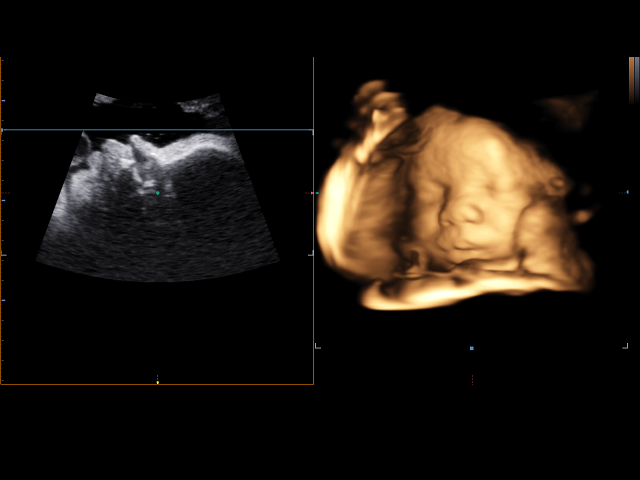
[im 6/32]
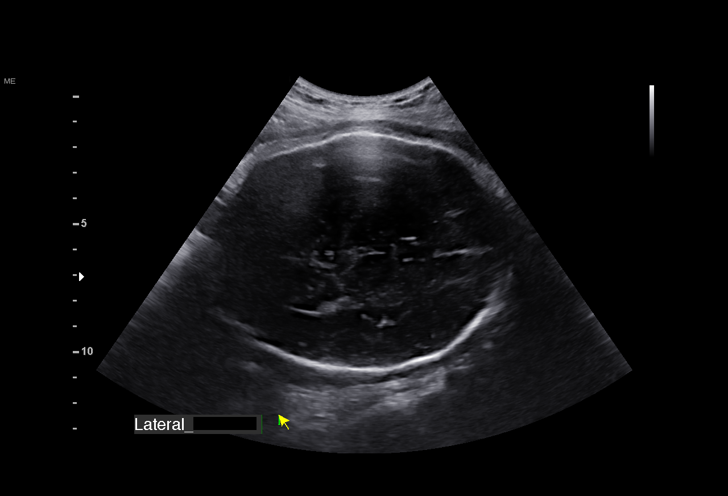
[im 9/32]
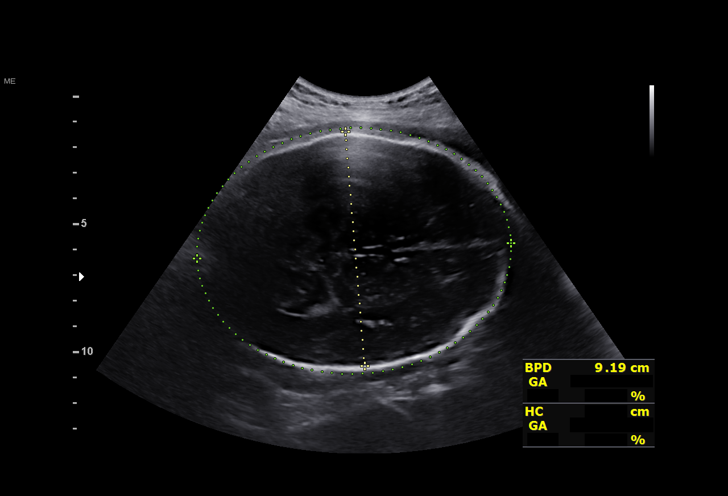
[im 11/32]
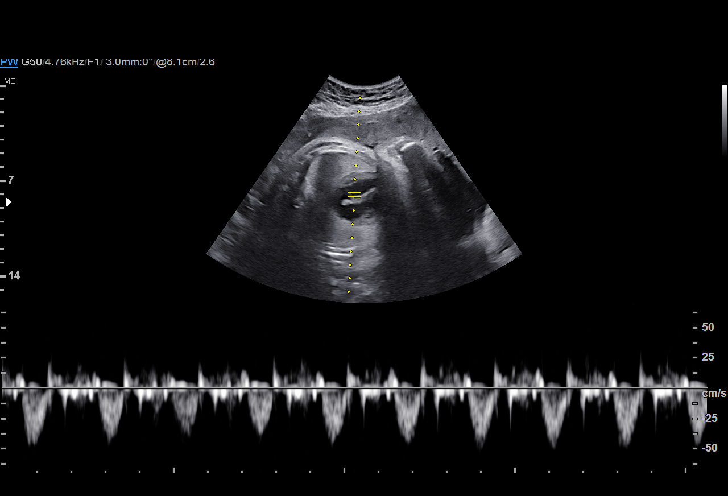
[im 14/32]
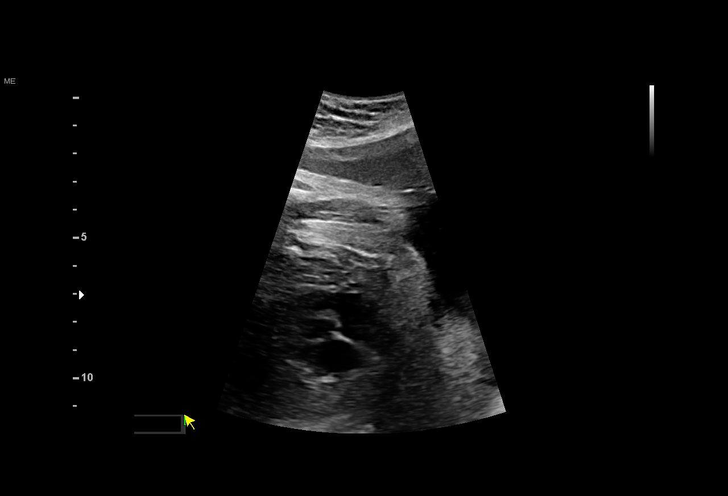
[im 16/32]
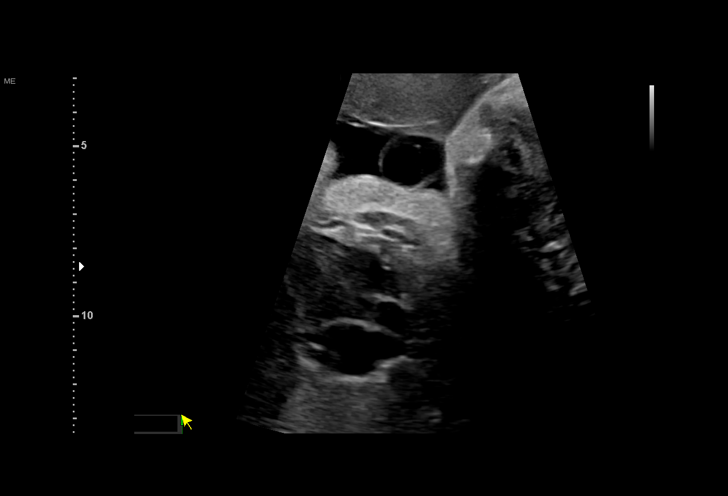
[im 18/32]
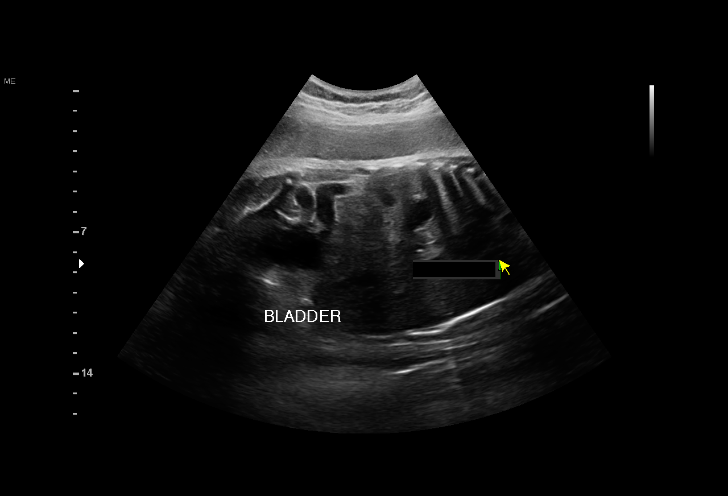
[im 21/32]
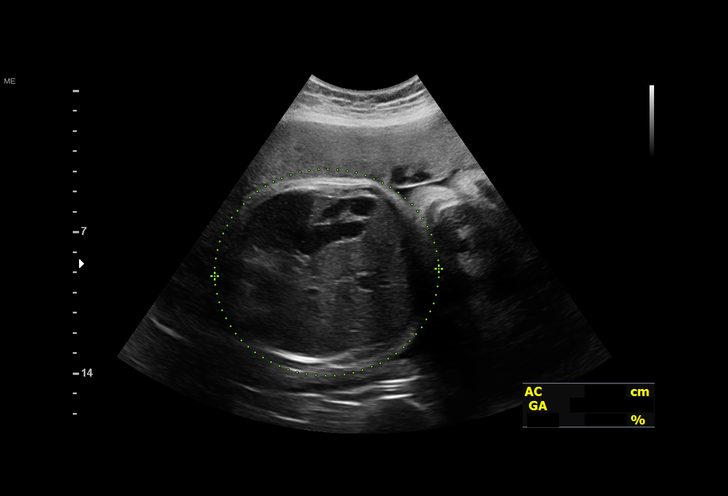
[im 23/32]
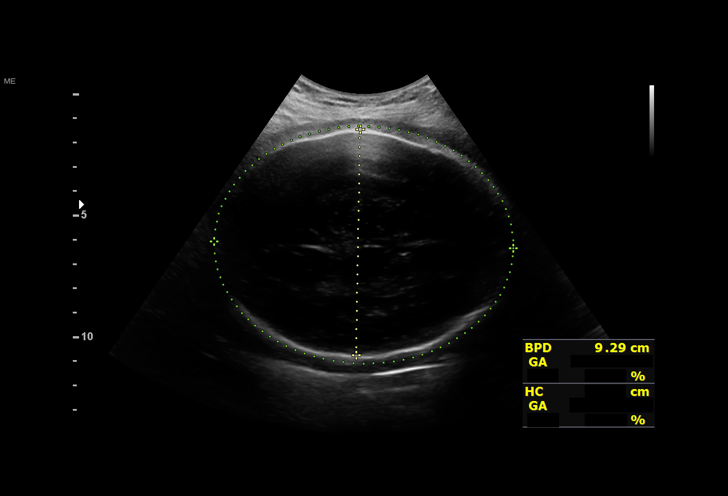
[im 26/32]
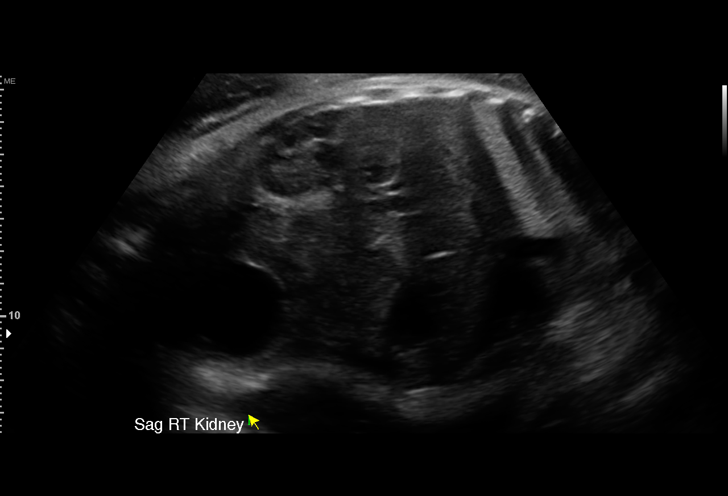
[im 28/32]
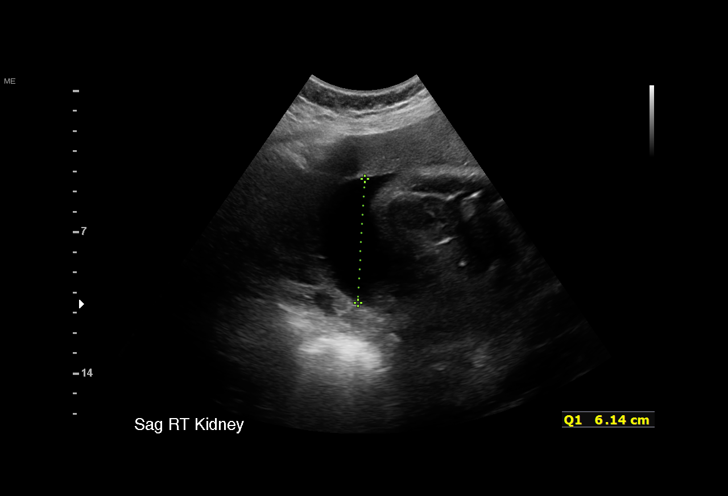
[im 30/32]
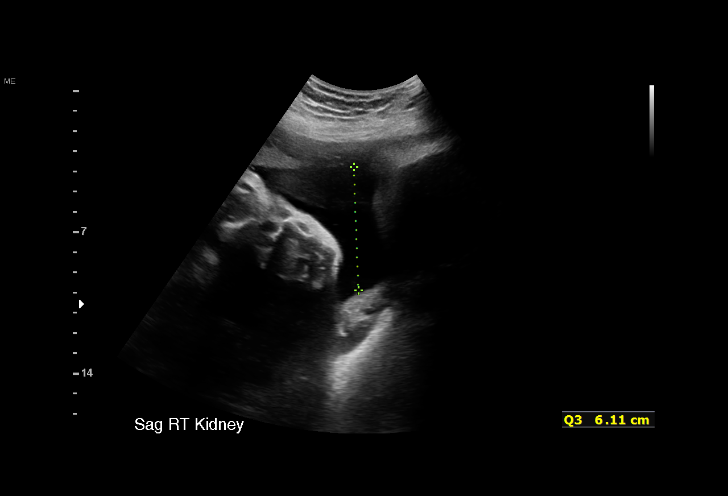

[Series 3: us mfm ob follow-up · 1 of 1 slices shown (2 of 2)]
[im 1/1]
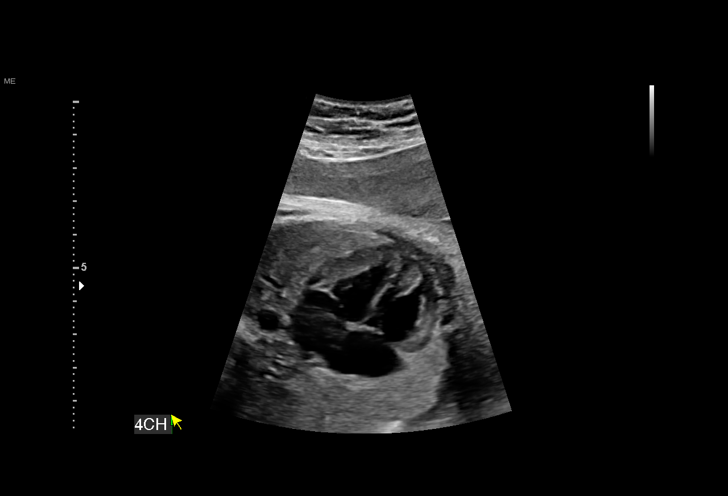

[14 of 28 positions shown; findings below may reference images not displayed]

Maternal [HOSPITAL]
                                                            at [HOSPITAL]
                                                            [facility]

Indications

 34 weeks gestation of pregnancyfetal aryth
 Fetal arrhythmia affecting pregnancy,
 antepartum
Fetal Evaluation

 Num Of Fetuses:         1
 Fetal Heart Rate(bpm):  137
 Cardiac Activity:       Observed
 Presentation:           Cephalic
 Placenta:               Anterior

 AFI Sum(cm)     %Tile       Largest Pocket(cm)
 21.8            82

 RUQ(cm)       RLQ(cm)       LUQ(cm)        LLQ(cm)

Biometry

 BPD:      92.3  mm     G. Age:  37w 3d         98  %    CI:        72.06   %    70 - 86
                                                         FL/HC:      20.3   %    20.1 -
 HC:       346   mm     G. Age:  40w 0d       > 99  %    HC/AC:      1.03        0.93 -
 AC:      336.8  mm     G. Age:  37w 4d         99  %    FL/BPD:     76.2   %    71 - 87
 FL:       70.3  mm     G. Age:  36w 0d         73  %    FL/AC:      20.9   %    20 - 24

 Est. FW:    0449  gm      7 lb 2 oz     98  %
Gestational Age

 LMP:           37w 4d        Date:  02/11/19                 EDD:   11/18/19
 U/S Today:     37w 5d                                        EDD:   11/17/19
 Best:          34w 6d     Det. By:  Early Ultrasound         EDD:   12/07/19
                                     (05/12/19)
Anatomy

 Cranium:               Appears normal         Ductal Arch:            Previously seen
 Cavum:                 Appears normal         Diaphragm:              Appears normal
 Ventricles:            Appears normal         Stomach:                Appears normal, left
                                                                       sided
 Choroid Plexus:        Appears normal         Abdomen:                Appears normal
 Cerebellum:            Previously seen        Abdominal Wall:         Previously seen
 Posterior Fossa:       Previously seen        Cord Vessels:           Previously seen
 Nuchal Fold:           Previously seen        Kidneys:                Appear normal
 Heart:                 Appears normal         Bladder:                Appears normal
                        (4CH, axis, and
                        situs)
 RVOT:                  Appears normal         Spine:                  Previously seen
 LVOT:                  Appears normal         Upper Extremities:      Previously seen
 Aortic Arch:           Previously seen        Lower Extremities:      Previously seen
Impression

 Patient return for ultrasound evaluation.  On previous scan,
 fetal heart arrhythmia is consistent with premature atrial
 contractions were seen.  Patient does not have gestational
 diabetes.  Blood pressure today at her office is 116/73 mmHg.
 Amniotic fluid is normal and good fetal activity is seen .  The
 estimated fetal weight is at the 98th percentile.  Fetal heart
 rate and rhythm or normal.

 We reassured the patient of the findings. I counseled the
 patient that ultrasound has limitations in accurately-
 estimating fetal weights especially in advanced gestation .
Recommendations

 -Follow-up estimation of fetal weight may be performed in 3
 weeks at your office.
                 [place], Seong-Ho

## 2019-11-01 NOTE — Telephone Encounter (Signed)
Patient is calling to find out about her Fmla paper work please advise

## 2019-11-01 NOTE — Telephone Encounter (Signed)
Pt aware copies up front

## 2019-11-05 ENCOUNTER — Other Ambulatory Visit: Payer: Self-pay

## 2019-11-05 ENCOUNTER — Encounter: Payer: Self-pay | Admitting: Advanced Practice Midwife

## 2019-11-05 ENCOUNTER — Ambulatory Visit (INDEPENDENT_AMBULATORY_CARE_PROVIDER_SITE_OTHER): Payer: No Typology Code available for payment source | Admitting: Advanced Practice Midwife

## 2019-11-05 VITALS — BP 108/72 | Wt 207.0 lb

## 2019-11-05 DIAGNOSIS — Z3A35 35 weeks gestation of pregnancy: Secondary | ICD-10-CM

## 2019-11-05 DIAGNOSIS — Z3483 Encounter for supervision of other normal pregnancy, third trimester: Secondary | ICD-10-CM

## 2019-11-05 NOTE — Progress Notes (Signed)
  Routine Prenatal Care Visit  Subjective  Erica Serrano is a 29 y.o. G2P1001 at [redacted]w[redacted]d being seen today for ongoing prenatal care.  She is currently monitored for the following issues for this low-risk pregnancy and has Von Willebrand disease (HCC); Migraine; Abnormal uterine bleeding (AUB); Menorrhagia with irregular cycle; Supervision of normal pregnancy; Labor and delivery, indication for care; [redacted] weeks gestation of pregnancy; and Fetal arrhythmia affecting pregnancy, antepartum on their problem list.  ----------------------------------------------------------------------------------- Patient reports good fetal movement. This is her last visit with Erica Serrano as she is moving next week to her new home and has an appointment scheduled with Erica Serrano provider.  She saw MFM this week and reports baby's EFW is 7#.  Contractions: Not present. Vag. Bleeding: None.  Movement: Present. Leaking Fluid denies.  ----------------------------------------------------------------------------------- The following portions of the patient's history were reviewed and updated as appropriate: allergies, current medications, past family history, past medical history, past social history, past surgical history and problem list. Problem list updated.  Objective  Blood pressure 108/72, weight 207 lb (93.9 kg), last menstrual period 02/11/2019. Pregravid weight 165 lb (74.8 kg) Total Weight Gain 42 lb (19.1 kg) Urinalysis: Urine Protein    Urine Glucose    Fetal Status: Fetal Heart Rate (bpm): 125   Movement: Present      NST: reactive 20 minute tracing, 125 bpm, moderate variability, +accelerations, -decelerations  General:  Alert, oriented and cooperative. Patient is in no acute distress.  Skin: Skin is warm and dry. No rash noted.   Cardiovascular: Normal heart rate noted  Respiratory: Normal respiratory effort, no problems with respiration noted  Abdomen: Soft, gravid, appropriate for gestational age. Pain/Pressure:  Absent     Pelvic:  Cervical exam deferred        Extremities: Normal range of motion.  Edema: None  Mental Status: Normal mood and affect. Normal behavior. Normal judgment and thought content.   Assessment   29 y.o. G2P1001 at [redacted]w[redacted]d by  12/07/2019, by Ultrasound presenting for routine prenatal visit  Plan   pregnancy2  Problems (from 02/11/19 to present)    Problem Noted Resolved   Supervision of normal pregnancy 05/12/2019 by Nadara Mustard, MD No   Overview Addendum 10/20/2019  4:46 PM by Tresea Mall, CNM    Clinic [facility] Prenatal Labs  Dating Erica Serrano 12 weeks Blood type: O/Positive/-- (02/17 1552)   Genetic Screen      NIPS: Antibody:Negative (02/17 1552)  Anatomic Erica Serrano [facility] nml Rubella: <0.90 (02/17 1552) = NonImmune.  Varicella: IMMune  GTT  Third trimester:  RPR: Non Reactive (02/17 1552)   Rhogam n/a HBsAg: Negative (02/17 1552)   Vaccines TDAP:  10/20/19         Flu Shot: 05/12/19 HIV: Non Reactive (02/17 1552)   Baby Food  Breast GBS:   Contraception  Depo Pap: 05/12/2019  CBB  No   CS/VBAC n/a   Support Person Erica Serrano, husband           Previous Version       Preterm labor symptoms and general obstetric precautions including but not limited to vaginal bleeding, contractions, leaking of fluid and fetal movement were reviewed in detail with the patient.   Return for transferring care to [place] area.  Tresea Mall, CNM 11/05/2019 4:15 PM

## 2019-11-05 NOTE — Progress Notes (Signed)
ROB NST 

## 2019-11-17 ENCOUNTER — Telehealth: Payer: Self-pay

## 2019-11-17 NOTE — Telephone Encounter (Signed)
Pt needs her FMLA faxed to [phone], it was picked up but her company needs it faxed.

## 2019-11-18 NOTE — Telephone Encounter (Signed)
Faxed
